# Patient Record
Sex: Female | Born: 1985 | Race: White | Hispanic: No | Marital: Married | Smoking: Never smoker
Health system: Southern US, Community
[De-identification: ages and names within clinical notes are randomized; demographics above are authoritative.]

## PROBLEM LIST (undated history)

## (undated) DIAGNOSIS — F419 Anxiety disorder, unspecified: Secondary | ICD-10-CM

## (undated) DIAGNOSIS — D509 Iron deficiency anemia, unspecified: Secondary | ICD-10-CM

## (undated) DIAGNOSIS — E538 Deficiency of other specified B group vitamins: Secondary | ICD-10-CM

## (undated) DIAGNOSIS — E871 Hypo-osmolality and hyponatremia: Secondary | ICD-10-CM

## (undated) DIAGNOSIS — N80129 Deep endometriosis of ovary, unspecified ovary: Secondary | ICD-10-CM

## (undated) DIAGNOSIS — E559 Vitamin D deficiency, unspecified: Secondary | ICD-10-CM

## (undated) DIAGNOSIS — K219 Gastro-esophageal reflux disease without esophagitis: Secondary | ICD-10-CM

## (undated) DIAGNOSIS — I10 Essential (primary) hypertension: Secondary | ICD-10-CM

## (undated) DIAGNOSIS — F32A Depression, unspecified: Secondary | ICD-10-CM

## (undated) DIAGNOSIS — R112 Nausea with vomiting, unspecified: Secondary | ICD-10-CM

## (undated) DIAGNOSIS — Z9889 Other specified postprocedural states: Secondary | ICD-10-CM

## (undated) HISTORY — PX: WISDOM TOOTH EXTRACTION: SHX21

---
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
## 2004-03-02 ENCOUNTER — Other Ambulatory Visit: Admission: RE | Admit: 2004-03-02 | Discharge: 2004-03-02 | Payer: Self-pay | Admitting: Obstetrics and Gynecology

## 2008-04-25 ENCOUNTER — Ambulatory Visit: Payer: Self-pay | Admitting: Vascular Surgery

## 2008-04-25 ENCOUNTER — Encounter (INDEPENDENT_AMBULATORY_CARE_PROVIDER_SITE_OTHER): Payer: Self-pay | Admitting: Obstetrics and Gynecology

## 2008-04-25 ENCOUNTER — Ambulatory Visit (HOSPITAL_COMMUNITY): Admission: RE | Admit: 2008-04-25 | Discharge: 2008-04-25 | Payer: Self-pay | Admitting: Obstetrics and Gynecology

## 2009-02-28 ENCOUNTER — Ambulatory Visit: Payer: Self-pay | Admitting: Family Medicine

## 2009-02-28 DIAGNOSIS — R030 Elevated blood-pressure reading, without diagnosis of hypertension: Secondary | ICD-10-CM

## 2009-02-28 LAB — CONVERTED CEMR LAB
ALT: 19 units/L (ref 0–35)
AST: 20 units/L (ref 0–37)
Basophils Relative: 0.1 % (ref 0.0–3.0)
Bilirubin, Direct: 0.1 mg/dL (ref 0.0–0.3)
Chloride: 108 meq/L (ref 96–112)
Eosinophils Relative: 0.9 % (ref 0.0–5.0)
GFR calc non Af Amer: 109.26 mL/min (ref >60)
HCT: 39.7 % (ref 36.0–46.0)
Lymphs Abs: 2.2 10*3/uL (ref 0.7–4.0)
MCV: 82.2 fL (ref 78.0–100.0)
Monocytes Absolute: 0.5 10*3/uL (ref 0.1–1.0)
Monocytes Relative: 5.7 % (ref 3.0–12.0)
Neutrophils Relative %: 69.1 % (ref 43.0–77.0)
Potassium: 3.9 meq/L (ref 3.5–5.1)
RBC: 4.83 M/uL (ref 3.87–5.11)
Total Bilirubin: 1 mg/dL (ref 0.3–1.2)
Total Protein: 7.1 g/dL (ref 6.0–8.3)
WBC: 8.9 10*3/uL (ref 4.5–10.5)

## 2009-03-03 ENCOUNTER — Ambulatory Visit: Payer: Self-pay | Admitting: Family Medicine

## 2009-04-06 ENCOUNTER — Ambulatory Visit: Payer: Self-pay | Admitting: Family Medicine

## 2009-04-06 DIAGNOSIS — R609 Edema, unspecified: Secondary | ICD-10-CM

## 2010-01-23 ENCOUNTER — Ambulatory Visit: Payer: Self-pay | Admitting: Obstetrics and Gynecology

## 2010-01-23 ENCOUNTER — Inpatient Hospital Stay (HOSPITAL_COMMUNITY): Admission: AD | Admit: 2010-01-23 | Discharge: 2010-01-23 | Payer: Self-pay | Admitting: Obstetrics & Gynecology

## 2010-08-03 ENCOUNTER — Inpatient Hospital Stay (HOSPITAL_COMMUNITY): Admission: AD | Admit: 2010-08-03 | Discharge: 2010-08-05 | Payer: Self-pay | Admitting: Obstetrics and Gynecology

## 2010-12-04 LAB — CBC
HCT: 33.6 % — ABNORMAL LOW (ref 36.0–46.0)
MCH: 28.2 pg (ref 26.0–34.0)
MCHC: 33.9 g/dL (ref 30.0–36.0)
MCHC: 34.1 g/dL (ref 30.0–36.0)
MCV: 83.1 fL (ref 78.0–100.0)
MCV: 84.3 fL (ref 78.0–100.0)
Platelets: 207 10*3/uL (ref 150–400)
Platelets: 243 10*3/uL (ref 150–400)
RBC: 3.56 MIL/uL — ABNORMAL LOW (ref 3.87–5.11)
RDW: 13.8 % (ref 11.5–15.5)
RDW: 13.9 % (ref 11.5–15.5)
WBC: 12.3 10*3/uL — ABNORMAL HIGH (ref 4.0–10.5)

## 2010-12-11 LAB — BASIC METABOLIC PANEL
Chloride: 102 mEq/L (ref 96–112)
Creatinine, Ser: 0.6 mg/dL (ref 0.4–1.2)
GFR calc Af Amer: >60 mL/min (ref >60)
GFR calc non Af Amer: >60 mL/min (ref >60)

## 2010-12-11 LAB — URINE MICROSCOPIC-ADD ON

## 2010-12-11 LAB — URINALYSIS, ROUTINE W REFLEX MICROSCOPIC
Bilirubin Urine: NEGATIVE
Ketones, ur: 15 mg/dL — AB
Nitrite: NEGATIVE
Protein, ur: NEGATIVE mg/dL
Urobilinogen, UA: 1 mg/dL (ref 0.0–1.0)

## 2010-12-11 LAB — CBC
MCV: 85.1 fL (ref 78.0–100.0)
Platelets: 326 10*3/uL (ref 150–400)
RBC: 4.35 MIL/uL (ref 3.87–5.11)
WBC: 8.9 10*3/uL (ref 4.0–10.5)

## 2011-11-26 ENCOUNTER — Encounter: Payer: Self-pay | Admitting: Family Medicine

## 2011-11-27 ENCOUNTER — Ambulatory Visit (INDEPENDENT_AMBULATORY_CARE_PROVIDER_SITE_OTHER): Payer: BC Managed Care – PPO | Admitting: Family Medicine

## 2011-11-27 ENCOUNTER — Encounter: Payer: Self-pay | Admitting: Family Medicine

## 2011-11-27 DIAGNOSIS — H8110 Benign paroxysmal vertigo, unspecified ear: Secondary | ICD-10-CM

## 2011-11-27 NOTE — Progress Notes (Signed)
  Subjective:    Patient ID: Melanie Carey, female    DOB: Jun 24, 1986, 26 y.o.   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 No spinning. No hearing loss otherwise well. These episodes last for about 30 minutes and occur 2-3 times a day. Review of systems negative   Review of Systems General and ENT and neurologic review of systems otherwise negative    Objective:   Physical Exam  Well-developed well-nourished female in no acute distress neurologic exam within normal limits cardiac exam normal cerebellar testing normal      Assessment & Plan:  Benign vertigo reassured return when necessary

## 2012-08-06 ENCOUNTER — Other Ambulatory Visit: Payer: Self-pay | Admitting: Cardiology

## 2012-08-06 ENCOUNTER — Encounter: Payer: Self-pay | Admitting: Family Medicine

## 2012-08-06 ENCOUNTER — Ambulatory Visit (INDEPENDENT_AMBULATORY_CARE_PROVIDER_SITE_OTHER): Payer: BC Managed Care – PPO | Admitting: Family Medicine

## 2012-08-06 ENCOUNTER — Other Ambulatory Visit: Payer: Self-pay | Admitting: Family Medicine

## 2012-08-06 ENCOUNTER — Encounter (INDEPENDENT_AMBULATORY_CARE_PROVIDER_SITE_OTHER): Payer: BC Managed Care – PPO

## 2012-08-06 VITALS — BP 140/94 | Temp 98.6°F | Wt 140.0 lb

## 2012-08-06 DIAGNOSIS — M79606 Pain in leg, unspecified: Secondary | ICD-10-CM

## 2012-08-06 DIAGNOSIS — M79609 Pain in unspecified limb: Secondary | ICD-10-CM

## 2012-08-06 DIAGNOSIS — M79669 Pain in unspecified lower leg: Secondary | ICD-10-CM

## 2012-08-06 DIAGNOSIS — R609 Edema, unspecified: Secondary | ICD-10-CM

## 2012-08-06 DIAGNOSIS — R600 Localized edema: Secondary | ICD-10-CM

## 2012-08-06 DIAGNOSIS — M7989 Other specified soft tissue disorders: Secondary | ICD-10-CM

## 2012-08-06 NOTE — Progress Notes (Signed)
  Subjective:    Patient ID: Melanie Carey, female    DOB: May 08, 1986, 26 y.o.   MRN: [id number]  HPI Melanie Carey is a 26 year old married female nonsmoker ,,,,,,,,,,, occupation Airline pilot,,,,,,,,,, who comes in today for evaluation of swelling of her right ankle and calf  She states that 3 years ago she had some swelling of her left lower extremity. We saw her did an evaluation which was negative. She subsequently went to an orthopedist had an MRI which was normal. That left leg continues to swell.  On Tuesday around noontime she noticed a gradual onset of swelling and pain in her right ankle. No history of trauma. She is on BCPs LMP 2 weeks ago she has a 27-year-old daughter.  No history of chest pain shortness of breath etc. etc.    Review of Systems    general and cardiopulmonary of systems otherwise negative Objective:   Physical Exam  Well-developed well-nourished female no acute distress examination of the right leg shows some tenderness to movement diffuse 1+ to trace edema tender cord left medial calf      Assessment & Plan:  Pain and swelling right lower extremity rule out DVT Doppler ASAP today

## 2012-08-06 NOTE — Patient Instructions (Addendum)
We will set up a venous Doppler ASAP  Take one aspirin tablet 3 times daily  Elevate that right leg in the evening when necessary  I will call you when I get the report on your Doppler  Also because of a slight elevation of her blood pressure asked her to do a BP check daily at home and call me with the data in 3 weeks

## 2012-08-14 ENCOUNTER — Other Ambulatory Visit: Payer: Self-pay | Admitting: Family

## 2012-08-14 ENCOUNTER — Ambulatory Visit (INDEPENDENT_AMBULATORY_CARE_PROVIDER_SITE_OTHER): Payer: BC Managed Care – PPO | Admitting: Family

## 2012-08-14 ENCOUNTER — Telehealth: Payer: Self-pay | Admitting: Family Medicine

## 2012-08-14 ENCOUNTER — Telehealth: Payer: Self-pay

## 2012-08-14 ENCOUNTER — Encounter: Payer: Self-pay | Admitting: Family

## 2012-08-14 VITALS — BP 154/102 | HR 90 | Temp 98.8°F | Wt 140.0 lb

## 2012-08-14 DIAGNOSIS — I10 Essential (primary) hypertension: Secondary | ICD-10-CM

## 2012-08-14 DIAGNOSIS — R609 Edema, unspecified: Secondary | ICD-10-CM

## 2012-08-14 LAB — BASIC METABOLIC PANEL
CO2: 29 mEq/L (ref 19–32)
Chloride: 100 mEq/L (ref 96–112)
Glucose, Bld: 84 mg/dL (ref 70–99)
Potassium: 4.2 mEq/L (ref 3.5–5.1)
Sodium: 136 mEq/L (ref 135–145)

## 2012-08-14 MED ORDER — METOPROLOL SUCCINATE ER 25 MG PO TB24: 25.0000 mg | ORAL_TABLET | Freq: Every day | ORAL | Status: DC

## 2012-08-14 MED ORDER — HYDROCHLOROTHIAZIDE 25 MG PO TABS: 12.5000 mg | ORAL_TABLET | Freq: Every day | ORAL | Status: DC

## 2012-08-14 NOTE — Telephone Encounter (Signed)
Left message to notify pt of NP note and to advise rx sent to pharmacy

## 2012-08-14 NOTE — Progress Notes (Signed)
  Subjective:    Patient ID: Melanie Carey, female    DOB: 22-May-1986, 26 y.o.   MRN: [id number]  HPI 26 year old white female, nonsmoker, patient of Dr. Tawanna Cooler, is in today with elevated blood pressure x several years but worsening. She reports taking her blood pressure at home and has recently been unable to get her systolic below 100. She has periodically had headaches. Also c/o edema in her ankles that has recently worsened. She has not been exercising.     Review of Systems  Constitutional: Negative.   Respiratory: Negative.   Cardiovascular: Positive for leg swelling. Negative for chest pain and palpitations.  Gastrointestinal: Negative.   Genitourinary: Negative.   Musculoskeletal: Negative.   Skin: Negative.   Neurological: Negative.   Psychiatric/Behavioral: Negative.    No past medical history on file.  History   Social History  . Marital Status: Married    Spouse Name: N/A    Number of Children: N/A  . Years of Education: N/A   Occupational History  . Not on file.   Social History Main Topics  . Smoking status: Never Smoker   . Smokeless tobacco: Not on file  . Alcohol Use: No  . Drug Use: No  . Sexually Active:    Other Topics Concern  . Not on file   Social History Narrative  . No narrative on file    No past surgical history on file.  Family History  Problem Relation Age of Onset  . Cancer Mother     breast  . Hypertension Father   . Hyperlipidemia Father     No Known Allergies  Current Outpatient Prescriptions on File Prior to Visit  Medication Sig Dispense Refill  . norgestrel-ethinyl estradiol (LO/OVRAL,CRYSELLE) 0.3-30 MG-MCG tablet Take 1 tablet by mouth daily.      . hydrochlorothiazide (HYDRODIURIL) 25 MG tablet Take 0.5 tablets (12.5 mg total) by mouth daily.  15 tablet  3    BP 154/102  Pulse 90  Temp 98.8 F (37.1 C) (Oral)  Wt 140 lb (63.504 kg)  SpO2 99%chart    Objective:   Physical Exam  Constitutional: She is oriented  to person, place, and time. She appears well-nourished.  Neck: Normal range of motion. Neck supple.  Cardiovascular: Normal rate, regular rhythm and normal heart sounds.   Pulmonary/Chest: Effort normal and breath sounds normal.  Abdominal: Soft. Bowel sounds are normal.  Musculoskeletal: Normal range of motion.  Neurological: She is alert and oriented to person, place, and time.  Skin: Skin is warm and dry.  Psychiatric: She has a normal mood and affect.          Assessment & Plan:  Assessment: Hypertension, Peripheral Edema  Plan: Start HCTZ 12.5 mg once daily. Encouraged a low sodium diet. Exercise 30 minutes most days of the week. Patient to recheck with Dr. Tawanna Cooler or myself in 2 weeks and sooner when necessary.

## 2012-08-14 NOTE — Telephone Encounter (Signed)
Per Padonda, hctz 12.5mg  qd is safe for pregnancy if pt were to become pregnant while taking it. Pt aware and verbalized understanding

## 2012-08-14 NOTE — Telephone Encounter (Signed)
Message copied by Beverely Low on Fri Aug 14, 2012  3:11 PM ------      Message from: Brooks, [place] B      Created: Fri Aug 14, 2012  1:51 PM       Both category B medications. But if she wants to switch, start Metoprolol 50mg  XL once daily.

## 2012-08-14 NOTE — Patient Instructions (Addendum)
1.5 Gram Low Sodium Diet A 1.5 gram sodium diet restricts the amount of sodium in the diet to no more than 1.5 g or 1500 mg daily. The American Heart Association recommends Americans over the age of 20 to consume no more than 1500 mg of sodium each day to reduce the risk of developing high blood pressure. Research also shows that limiting sodium may reduce heart attack and stroke risk. Many foods contain sodium for flavor and sometimes as a preservative. When the amount of sodium in a diet needs to be low, it is important to know what to look for when choosing foods and drinks. The following includes some information and guidelines to help make it easier for you to adapt to a low sodium diet. QUICK TIPS  Do not add salt to food.  Avoid convenience items and fast food.  Choose unsalted snack foods.  Buy lower sodium products, often labeled as "lower sodium" or "no salt added."  Check food labels to learn how much sodium is in 1 serving.  When eating at a restaurant, ask that your food be prepared with less salt or none, if possible. READING FOOD LABELS FOR SODIUM INFORMATION The nutrition facts label is a good place to find how much sodium is in foods. Look for products with no more than 400 mg of sodium per serving. Remember that 1.5 g = 1500 mg. The food label may also list foods as:  Sodium-free: Less than 5 mg in a serving.  Very low sodium: 35 mg or less in a serving.  Low-sodium: 140 mg or less in a serving.  Light in sodium: 50% less sodium in a serving. For example, if a food that usually has 300 mg of sodium is changed to become light in sodium, it will have 150 mg of sodium.  Reduced sodium: 25% less sodium in a serving. For example, if a food that usually has 400 mg of sodium is changed to reduced sodium, it will have 300 mg of sodium. CHOOSING FOODS Grains  Avoid: Salted crackers and snack items. Some cereals, including instant hot cereals. Bread stuffing and biscuit mixes.  Seasoned rice or pasta mixes.  Choose: Unsalted snack items. Low-sodium cereals, oats, puffed wheat and rice, shredded wheat. English muffins and bread. Pasta. Meats  Avoid: Salted, canned, smoked, spiced, pickled meats, including fish and poultry. Bacon, ham, sausage, cold cuts, hot dogs, anchovies.  Choose: Low-sodium canned tuna and salmon. Fresh or frozen meat, poultry, and fish. Dairy  Avoid: Processed cheese and spreads. Cottage cheese. Buttermilk and condensed milk. Regular cheese.  Choose: Milk. Low-sodium cottage cheese. Yogurt. Sour cream. Low-sodium cheese. Fruits and Vegetables  Avoid: Regular canned vegetables. Regular canned tomato sauce and paste. Frozen vegetables in sauces. Olives. Pickles. Relishes. Sauerkraut.  Choose: Low-sodium canned vegetables. Low-sodium tomato sauce and paste. Frozen or fresh vegetables. Fresh and frozen fruit. Condiments  Avoid: Canned and packaged gravies. Worcestershire sauce. Tartar sauce. Barbecue sauce. Soy sauce. Steak sauce. Ketchup. Onion, garlic, and table salt. Meat flavorings and tenderizers.  Choose: Fresh and dried herbs and spices. Low-sodium varieties of mustard and ketchup. Lemon juice. Tabasco sauce. Horseradish. SAMPLE 1.5 GRAM SODIUM MEAL PLAN Breakfast / Sodium (mg)  1 cup low-fat milk / 143 mg  1 whole-wheat English muffin / 240 mg  1 tbs heart-healthy margarine / 153 mg  1 hard-boiled egg / 139 mg  1 small orange / 0 mg Lunch / Sodium (mg)  1 cup raw carrots / 76 mg  2   tbs no salt added peanut butter / 5 mg  2 slices whole-wheat bread / 270 mg  1 tbs jelly / 6 mg   cup red grapes / 2 mg Dinner / Sodium (mg)  1 cup whole-wheat pasta / 2 mg  1 cup low-sodium tomato sauce / 73 mg  3 oz lean ground beef / 57 mg  1 small side salad (1 cup raw spinach leaves,  cup cucumber,  cup yellow bell pepper) with 1 tsp olive oil and 1 tsp red wine vinegar / 25 mg Snack / Sodium (mg)  1 container low-fat  vanilla yogurt / 107 mg  3 graham cracker squares / 127 mg Nutrient Analysis  Calories: 1745  Protein: 75 g  Carbohydrate: 237 g  Fat: 57 g  Sodium: 1425 mg Document Released: 09/09/2005 Document Revised: 12/02/2011 Document Reviewed: 12/11/2009 [facility] Patient Information 2013 [place], [place].  Hypertension As your heart beats, it forces blood through your arteries. This force is your blood pressure. If the pressure is too high, it is called hypertension (HTN) or high blood pressure. HTN is dangerous because you may have it and not know it. High blood pressure may mean that your heart has to work harder to pump blood. Your arteries may be narrow or stiff. The extra work puts you at risk for heart disease, stroke, and other problems.  Blood pressure consists of two numbers, a higher number over a lower, 110/72, for example. It is stated as "110 over 72." The ideal is below 120 for the top number (systolic) and under 80 for the bottom (diastolic). Write down your blood pressure today. You should pay close attention to your blood pressure if you have certain conditions such as:  Heart failure.  Prior heart attack.  Diabetes  Chronic kidney disease.  Prior stroke.  Multiple risk factors for heart disease. To see if you have HTN, your blood pressure should be measured while you are seated with your arm held at the level of the heart. It should be measured at least twice. A one-time elevated blood pressure reading (especially in the Emergency Department) does not mean that you need treatment. There may be conditions in which the blood pressure is different between your right and left arms. It is important to see your caregiver soon for a recheck. Most people have essential hypertension which means that there is not a specific cause. This type of high blood pressure may be lowered by changing lifestyle factors such as:  Stress.  Smoking.  Lack of exercise.  Excessive  weight.  Drug/tobacco/alcohol use.  Eating less salt. Most people do not have symptoms from high blood pressure until it has caused damage to the body. Effective treatment can often prevent, delay or reduce that damage. TREATMENT  When a cause has been identified, treatment for high blood pressure is directed at the cause. There are a large number of medications to treat HTN. These fall into several categories, and your caregiver will help you select the medicines that are best for you. Medications may have side effects. You should review side effects with your caregiver. If your blood pressure stays high after you have made lifestyle changes or started on medicines,   Your medication(s) may need to be changed.  Other problems may need to be addressed.  Be certain you understand your prescriptions, and know how and when to take your medicine.  Be sure to follow up with your caregiver within the time frame advised (usually within two weeks)  to have your blood pressure rechecked and to review your medications.  If you are taking more than one medicine to lower your blood pressure, make sure you know how and at what times they should be taken. Taking two medicines at the same time can result in blood pressure that is too low. SEEK IMMEDIATE MEDICAL CARE IF:  You develop a severe headache, blurred or changing vision, or confusion.  You have unusual weakness or numbness, or a faint feeling.  You have severe chest or abdominal pain, vomiting, or breathing problems. MAKE SURE YOU:   Understand these instructions.  Will watch your condition.  Will get help right away if you are not doing well or get worse. Document Released: 09/09/2005 Document Revised: 12/02/2011 Document Reviewed: 04/29/2008 [facility] Patient Information 2013 [place], [place].

## 2012-08-14 NOTE — Telephone Encounter (Signed)
Patient Information:  Caller Name: Joene  Phone: [phone]  Patient: Melanie Carey, Melanie Carey  Gender: Female  DOB: 1986-09-20  Age: 26 Years  PCP: Kelle Darting [facility])  Pregnant: No   Symptoms  Reason For Call & Symptoms: swelling right lower leg and foot.  BP 140/100 all week; also has headaches intermittently x 1 month  Reviewed Health History In EMR: Yes  Reviewed Medications In EMR: Yes  Reviewed Allergies In EMR: Yes  Date of Onset of Symptoms: 08/03/2012 OB:  LMP: 07/30/2012  Guideline(s) Used:  Leg Swelling and Edema  Disposition Per Guideline:   See Today in Office  Reason For Disposition Reached:   Moderate swelling of both ankles (e.g., swelling extends up to the knees) AND new onset or worsening  Advice Given:  N/A  Office Follow Up:  Does the office need to follow up with this patient?: No  Instructions For The Office: N/A  Appointment Scheduled:  08/14/2012 10:30:00  RN Note:  Seen in office 08/06/12 and told to monitor BP; BP has been 140's/100 all week.  Appt scheduled with Ms. Orvan Falconer 08/14/12 1030, as Dr. Tawanna Cooler not in office krs/can

## 2012-08-14 NOTE — Telephone Encounter (Signed)
Pt called and said that she has questions re: hydrochlorothiazide (HYDRODIURIL) 25 MG tablet

## 2012-10-02 ENCOUNTER — Ambulatory Visit (INDEPENDENT_AMBULATORY_CARE_PROVIDER_SITE_OTHER): Payer: BC Managed Care – PPO | Admitting: Family Medicine

## 2012-10-02 ENCOUNTER — Encounter: Payer: Self-pay | Admitting: Family Medicine

## 2012-10-02 VITALS — BP 130/80 | HR 84 | Temp 98.7°F | Wt 144.0 lb

## 2012-10-02 DIAGNOSIS — J069 Acute upper respiratory infection, unspecified: Secondary | ICD-10-CM

## 2012-10-02 MED ORDER — GUAIFENESIN-CODEINE 100-10 MG/5ML PO SYRP: 5.0000 mL | ORAL_SOLUTION | Freq: Three times a day (TID) | ORAL | Status: DC | PRN

## 2012-10-02 NOTE — Progress Notes (Signed)
Chief Complaint  Patient presents with  . Cough    congestion, runny nose, ears stopped up, sore throat, pressure in head 1 week     HPI: ? URI: -started: 1 week -symptoms:nasal congestion, sore throat, cough, having some sinus pressure, ears are full, hoarseness -denies:fever, SOB, NVD, tooth pain -has tried: dayquil -sick contacts: no flu exposure, friend's daughter sick -Hx of: sinusitis  ROS: See pertinent positives and negatives per HPI.  No past medical history on file.  Family History  Problem Relation Age of Onset  . Cancer Mother     breast  . Hypertension Father   . Hyperlipidemia Father     History   Social History  . Marital Status: Married    Spouse Name: N/A    Number of Children: N/A  . Years of Education: N/A   Social History Main Topics  . Smoking status: Never Smoker   . Smokeless tobacco: None  . Alcohol Use: No  . Drug Use: No  . Sexually Active:    Other Topics Concern  . None   Social History Narrative  . None    Current outpatient prescriptions:hydrochlorothiazide (HYDRODIURIL) 25 MG tablet, Take 0.5 tablets (12.5 mg total) by mouth daily., Disp: 15 tablet, Rfl: 3;  metoprolol succinate (TOPROL-XL) 25 MG 24 hr tablet, Take 1 tablet (25 mg total) by mouth daily., Disp: 90 tablet, Rfl: 3;  norgestrel-ethinyl estradiol (LO/OVRAL,CRYSELLE) 0.3-30 MG-MCG tablet, Take 1 tablet by mouth daily., Disp: , Rfl:  guaiFENesin-codeine (ROBITUSSIN AC) 100-10 MG/5ML syrup, Take 5 mLs by mouth 3 (three) times daily as needed for cough., Disp: 120 mL, Rfl: 0  EXAM:  Filed Vitals:   10/02/12 0909  BP: 130/80  Pulse: 84  Temp: 98.7 F (37.1 C)    There is no height on file to calculate BMI.  GENERAL: vitals reviewed and listed above, alert, oriented, appears well hydrated and in no acute distress  HEENT: atraumatic, conjunttiva clear, no obvious abnormalities on inspection of external nose and ears, normal appearance of ear canals and TMs, clear  nasal congestion, mild post oropharyngeal erythema with PND, no tonsillar edema or exudate, no sinus TTP  NECK: no obvious masses on inspection  LUNGS: clear to auscultation bilaterally, no wheezes, rales or rhonchi, good air movement  CV: HRRR, no peripheral edema  MS: moves all extremities without noticeable abnormality  PSYCH: pleasant and cooperative, no obvious depression or anxiety  ASSESSMENT AND PLAN:  Discussed the following assessment and plan:  1. Upper respiratory infection  guaiFENesin-codeine (ROBITUSSIN AC) 100-10 MG/5ML syrup   -supportive care, return precautions, cough med (risks discussed) -Patient advised to return or notify a doctor immediately if symptoms worsen or persist or new concerns arise.  Patient Instructions  INSTRUCTIONS FOR UPPER RESPIRATORY INFECTION:  -plenty of rest and fluids  -nasal saline wash 2-3 times daily (use prepackaged nasal saline or bottled/distilled water if making your own)   -can use sinex nasal spray for drainage and nasal congestion - but do NOT use longer then 3-4 days  -can use tylenol or ibuprofen as directed for aches and sorethroat  -in the winter time, using a humidifier at night is helpful (please follow cleaning instructions)  -if you are taking a cough medication - use only as directed, may also try a teaspoon of honey to coat the throat and throat lozenges  -for sore throat, salt water gargles can help  -follow up if you have fevers, facial pain, tooth pain, difficulty breathing or are worsening  or not getting better in 5-7 days      KIM, [facility] R.

## 2012-10-02 NOTE — Patient Instructions (Addendum)

## 2012-10-08 ENCOUNTER — Telehealth: Payer: Self-pay | Admitting: Family Medicine

## 2012-10-08 MED ORDER — AMOXICILLIN-POT CLAVULANATE 875-125 MG PO TABS: 1.0000 | ORAL_TABLET | Freq: Two times a day (BID) | ORAL | Status: DC

## 2012-10-08 NOTE — Telephone Encounter (Signed)
Melanie Carey,  Please call and see how she is doing. If much worse or fevers - needs to be seen. Otherwise can do amoxicillin 875 bid for 10 days for possible sinusits given symptoms > 10 days. Take complete course as instructed. If any diarrhea or reaction to this medication should stop and call us. Can cause diarrhea, allergic reaction and many other side effects - she can discuss with the pharmacist. Just finishing period per report so pregnancy unlikely.

## 2012-10-08 NOTE — Telephone Encounter (Signed)
Left a message for pt to return call 

## 2012-10-08 NOTE — Telephone Encounter (Signed)
Patient Information:  Caller Name: Teisha  Phone: [phone]  Patient: Melanie Carey, Melanie Carey  Gender: Female  DOB: [birth year]  Age: 27 Years  PCP: Kelle Darting [facility])  Pregnant: No  Office Follow Up:  Does the office need to follow up with this patient?: Yes  Instructions For The Office: request RX  RN Note:  Patient is calling for medication. Antibiotic.  Does not feel well. Symptoms have moved into face/ear.  She is at work and does not want to miss work if she can help it.  See in the office by Kriste Basque DO on 10/02/12.  Please contact.  Aware of office policy regarding antibiotics  Symptoms  Reason For Call & Symptoms: Patient states she has been sick since Monday 09/29/11 with URI.  She was seen by Herold Harms and given Robitussin AC.  She states the cough is better but has moved back into her forehead pressure, ears are ringing and Headache, +runny nose  +Clear  Reviewed Health History In EMR: Yes  Reviewed Medications In EMR: Yes  Reviewed Allergies In EMR: Yes  Reviewed Surgeries / Procedures: No  Date of Onset of Symptoms: 09/28/2012  Treatments Tried: Robitussin AC-, saliene spray, Excedrin Migraine.  Treatments Tried Worked: No OB / GYN:  LMP: 09/26/2012  Guideline(s) Used:  Sinus Pain and Congestion  Disposition Per Guideline:   See Today or Tomorrow in Office  Reason For Disposition Reached:   Sinus congestion (pressure, fullness) present > 10 days  Advice Given:  For a Runny Nose With Profuse Discharge:  Nasal mucus and discharge helps to wash viruses and bacteria out of the nose and sinuses.  Blowing the nose is all that is needed.  If the skin around your nostrils gets irritated, apply a tiny amount of petroleum ointment to the nasal openings once or twice a day.  For a Stuffy Nose - Use Nasal Washes:  Introduction: Saline (salt water) nasal irrigation (nasal wash) is an effective and simple home remedy for treating stuffy nose and sinus congestion. The  nose can be irrigated by pouring, spraying, or squirting salt water into the nose and then letting it run back out.  How it Helps: The salt water rinses out excess mucus, washes out any irritants (dust, allergens) that might be present, and moistens the nasal cavity.  Methods: There are several ways to perform nasal irrigation. You can use a saline nasal spray bottle (available over-the-counter), a rubber ear syringe, a medical syringe without the needle, or a Neti Pot.  Pain and Fever Medicines:  Ibuprofen (e.g., Motrin, Advil):  Take 400 mg (two 200 mg pills) by mouth every 6 hours.  Another choice is to take 600 mg (three 200 mg pills) by mouth every 8 hours.  Call Back If:   Severe pain lasts longer than 2 hours after pain medicine  Sinus pain lasts longer than 1 day after starting treatment using nasal washes  Sinus congestion (fullness) lasts longer than 10 days  Fever lasts longer than 3 days  You become worse.  Hydration:  Drink plenty of liquids (6-8 glasses of water daily). If the air in your home is dry, use a cool mist humidifier  Expected Course:  Sinus congestion from viral upper respiratory infections (colds) usually lasts 5-10 days.  Occasionally a cold can worsen and turn into bacterial sinusitis. Clues to this are sinus symptoms lasting longer than 10 days, fever lasting longer than 3 days, and worsening pain. Bacterial sinusitis may need antibiotic  treatment.  Patient Refused Recommendation:  Patient Requests Prescription  Request RX

## 2012-10-08 NOTE — Telephone Encounter (Signed)
pls advise

## 2012-10-08 NOTE — Telephone Encounter (Signed)
Rx sent to pharmacy.  Called and spoke with pt and pt denies fever.  Pt states she still has sinus headache and ringing in her ears.  Pt states it has been 12 days and she just wants some relief.  Pt aware rx sent to pharmacy.  Discussed poss side effects.

## 2012-10-08 NOTE — Addendum Note (Signed)
Addended by: Azucena Freed on: 10/08/2012 04:30 PM   Modules accepted: Orders

## 2012-10-30 ENCOUNTER — Telehealth: Payer: Self-pay | Admitting: Family Medicine

## 2012-10-30 NOTE — Telephone Encounter (Signed)
Patient Information:  Caller Name: Mellody  Phone: [phone]  Patient: Melanie Carey, Melanie Carey  Gender: Female  DOB: [birth year]  Age: 27 Years  PCP: Kelle Darting [facility])  Pregnant: No  Office Follow Up:  Does the office need to follow up with this patient?: No  Instructions For The Office: N/A  RN Note:  Onset of ear ringing was with cold that was treated with antibiotics.  However, the ringing or background sound has not improved and feels like it has gotten to be higher in pitch.  Patient feels like she can hear it just is always there in the background.  It is worse at night when things are quiet, she is more aware of it. Is having slight dizziness.  Appointment schedule with Dr. Tawanna Cooler for 11/02/12 at 11:00.  Symptoms  Reason For Call & Symptoms: Ringing in ears  Reviewed Health History In EMR: Yes  Reviewed Medications In EMR: Yes  Reviewed Allergies In EMR: Yes  Reviewed Surgeries / Procedures: Yes  Date of Onset of Symptoms: 09/30/2012  Treatments Tried: antibiotic  Treatments Tried Worked: No OB / GYN:  LMP: 10/17/2012  Guideline(s) Used:  Hearing Loss  Ear - Congestion  Disposition Per Guideline:   See Within 3 Days in Office  Reason For Disposition Reached:   Ear congestion present > 48 hours  Advice Given:  Treatment - Chewing and Swallowing:   Try chewing gum.  You can also try swallowing water while pinching your nostrils closed. The reason this works is that it creates a small vacuum in the nose. This helps the eustachian tube to open up.  Treatment - Decongestant Nasal Spray:  If chewing or swallowing doesn't help after 1 or 2 hours, you can try using an over-the-counter (OTC) nasal decongestant drops. You can use the nose drops twice a day.  Call Back If:   Ear congestion lasts over 48 hours  Ear pain or fever occurs  You become worse.  Appointment Scheduled:  11/02/2012 11:00:00 Appointment Scheduled Provider:  Kelle Darting [facility])

## 2012-10-30 NOTE — Telephone Encounter (Signed)
noted 

## 2012-11-02 ENCOUNTER — Ambulatory Visit: Payer: Self-pay | Admitting: Family Medicine

## 2012-11-02 DIAGNOSIS — Z0289 Encounter for other administrative examinations: Secondary | ICD-10-CM

## 2012-12-01 ENCOUNTER — Ambulatory Visit (INDEPENDENT_AMBULATORY_CARE_PROVIDER_SITE_OTHER): Payer: BC Managed Care – PPO | Admitting: Family Medicine

## 2012-12-01 ENCOUNTER — Encounter: Payer: Self-pay | Admitting: Family Medicine

## 2012-12-01 VITALS — BP 138/80 | HR 67 | Temp 98.6°F | Wt 142.0 lb

## 2012-12-01 DIAGNOSIS — H6990 Unspecified Eustachian tube disorder, unspecified ear: Secondary | ICD-10-CM

## 2012-12-01 DIAGNOSIS — H6991 Unspecified Eustachian tube disorder, right ear: Secondary | ICD-10-CM

## 2012-12-01 DIAGNOSIS — I10 Essential (primary) hypertension: Secondary | ICD-10-CM

## 2012-12-01 MED ORDER — FLUTICASONE PROPIONATE 50 MCG/ACT NA SUSP: 2.0000 | Freq: Every day | NASAL | Status: DC

## 2012-12-01 MED ORDER — HYDROCHLOROTHIAZIDE 25 MG PO TABS: 25.0000 mg | ORAL_TABLET | Freq: Every day | ORAL | Status: DC

## 2012-12-01 NOTE — Progress Notes (Signed)
Chief Complaint  Patient presents with  . 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  No past medical history on file.  Family History  Problem Relation Age of Onset  . Cancer Mother     breast  . Hypertension Father   . Hyperlipidemia Father     History   Social History  . Marital Status: Married    Spouse Name: N/A    Number of Children: N/A  . Years of Education: N/A   Social History Main Topics  . Smoking status: Never Smoker   . Smokeless tobacco: None  . Alcohol Use: No  . Drug Use: No  . Sexually Active:    Other Topics Concern  . None   Social History Narrative  . None    Current outpatient prescriptions:norgestrel-ethinyl estradiol (LO/OVRAL,CRYSELLE) 0.3-30 MG-MCG tablet, Take 1 tablet by mouth daily., Disp: , Rfl: ;  fluticasone (FLONASE) 50 MCG/ACT nasal spray, Place 2 sprays into the nose daily., Disp: 16 g, Rfl: 0;  hydrochlorothiazide (HYDRODIURIL) 25 MG tablet, Take 1 tablet (25 mg total) by mouth daily., Disp: 30 tablet, Rfl: 3  EXAM:  Filed Vitals:   12/01/12 1408  BP: 138/80  Pulse: 67  Temp: 98.6 F (37 C)     Body mass index is 20.96 kg/(m^2).  GENERAL: vitals reviewed and listed above, alert, oriented, appears well hydrated and in no acute distress  HEENT: atraumatic, conjunttiva clear, no obvious abnormalities on inspection of external nose and ears, normal appearance of ear canals and TMs except for clear effusion R, clear nasal congestion with pale boggy turbinates, mild post oropharyngeal erythema with PND, no tonsillar edema or exudate, no sinus TTP  NECK: no obvious masses on inspection  LUNGS: clear to auscultation bilaterally, no wheezes, rales or rhonchi, good air movement  CV: HRRR, no peripheral edema - ? Small amount of swelling R foot  MS: moves all extremities without noticeable abnormality  PSYCH: pleasant and cooperative, no obvious depression or anxiety  ASSESSMENT AND PLAN:  Discussed the following assessment and plan:  Eustachian tube disorder, right - Plan: fluticasone (FLONASE) 50 MCG/ACT nasal spray  Essential hypertension, benign - Plan: hydrochlorothiazide (HYDRODIURIL) 25 MG tablet  -BP fine here -advised stopping BB given can cause swelling and tinitus, she doesn't like it, and she currently is single, not trying to get pregnant and on birth control so pregnancy not likely an issue -restart HCTZ  -flonase 2 sprays each nostil daily for next two weeks - if ear symptoms do not improve see ENT - number provided to call -follow up with PCP in 1  month -Patient advised to return or notify a doctor immediately if symptoms worsen or persist or new concerns arise.  Patient Instructions  -flonase 2 sprays each nostril daily for the next 2 weeks  -stop the metoprolol and start the hydrochlorothiazide  -if ringing in ears continues in 2 weeks - see ENT per brochure provided  -follow up with your doctor in 1 month       KIM, [facility] R.

## 2012-12-01 NOTE — Patient Instructions (Signed)
-  flonase 2 sprays each nostril daily for the next 2 weeks  -stop the metoprolol and start the hydrochlorothiazide  -if ringing in ears continues in 2 weeks - see ENT per brochure provided  -follow up with your doctor in 1 month

## 2013-03-02 ENCOUNTER — Telehealth: Payer: Self-pay | Admitting: *Deleted

## 2013-03-02 NOTE — Telephone Encounter (Signed)
Pt called requesting a genetic appt and I confirmed 04/22/13 genetic appt w/ pt.  Requested for her to call [facility] and have them to fax me records since they require a release.  She will do.

## 2013-03-06 ENCOUNTER — Encounter: Payer: Self-pay | Admitting: Family Medicine

## 2013-03-06 ENCOUNTER — Ambulatory Visit (INDEPENDENT_AMBULATORY_CARE_PROVIDER_SITE_OTHER): Payer: BC Managed Care – PPO | Admitting: Family Medicine

## 2013-03-06 VITALS — BP 144/88 | HR 66 | Temp 98.1°F | Wt 137.0 lb

## 2013-03-06 DIAGNOSIS — H9319 Tinnitus, unspecified ear: Secondary | ICD-10-CM

## 2013-03-06 DIAGNOSIS — H9313 Tinnitus, bilateral: Secondary | ICD-10-CM

## 2013-03-06 DIAGNOSIS — R609 Edema, unspecified: Secondary | ICD-10-CM

## 2013-03-06 DIAGNOSIS — R600 Localized edema: Secondary | ICD-10-CM

## 2013-03-06 NOTE — Assessment & Plan Note (Signed)
I am not concerned for cardiac source of chest symptoms.  Would have pt take an extra dose of HCTZ today, call PCP for follow up BP and take her cuff in for calibration.  Elevate feet prn. Limit salt.

## 2013-03-06 NOTE — Progress Notes (Signed)
She had elevated BP before and was on metoprolol.  She was taken off it prev.  Now has been taking HCTZ for a few months.  That had worked well for a period of time, with BP controlled and no edema.    Her R foot tends to get puffy when her pressure goes up.  She has a HA today.  R frontal area.  No FCNAVD.  No vision changes.    She had some mild CP last night, it started about 8PM, was intermittent, would last ~30 sec, self resolved.  Not exertional, not typical chest pain.  No heart racing, she checked her pulse.  Not SOB.  She hadn't had episodes like this prev.   No known salt load in diet, but she isn't on a low salt diet.  Yesterday was a stressful day.  She thinks that stress mgmt is important for her BP control.   Max BPs at home 150s/90s.   Not dizzy on standing recently.    She has had B ear ringing since 1/14.  Was prev on flonase.  No help with nasal steroids.   Meds, vitals, and allergies reviewed.   ROS: See HPI.  Otherwise, noncontributory.  GEN: nad, alert and oriented HEENT: mucous membranes moist, R TM with SOM noted, L TM wnl, nasal and OP exam wnl NECK: supple w/o LA CV: rrr. No murmur PULM: ctab, no inc wob ABD: soft, +bs EXT: no edema SKIN: no acute rash

## 2013-03-06 NOTE — Patient Instructions (Signed)
Take an extra HCTZ today.  Look up the DASH diet (low salt).  Elevate your feet.  Call your PCP about getting your BP rechecked and take your cuff to the clinic. Ask your PCP about the ear ringing.

## 2013-03-06 NOTE — Assessment & Plan Note (Signed)
I would have patient ask PCP about f/u for [facility] and ear ringing.

## 2013-03-08 ENCOUNTER — Telehealth: Payer: Self-pay | Admitting: Family Medicine

## 2013-03-08 NOTE — Telephone Encounter (Signed)
[facility] Triage Call Report Triage Record Num: [id number] Operator: Rebeca Allegra Patient Name: Melanie Carey Call Date & Time: 03/05/2013 10:08:26PM Patient Phone: [phone] PCP: Eugenio Hoes. Todd Patient Gender: Female PCP Fax : [phone] Patient DOB: 1986/04/16 Practice Name: [facility] Reason for Call: Caller: [name]/Patient; PCP: Kelle Darting [facility]); CB#: [phone]; Call regarding Chest Pain/Chest Discomfort; onset 03/05/13 2000-intermittent, sharp pain lasting approx 30sec. Denies chest pain at the moment or within the last hour. 03/05/13 2000 BP: 147/107; 2213: 150/113. Afebrile. All emergent symptoms ruled out per Hypertension, Diagnosed or suspected protocol with the exception of "Multiple elevated blood pressure readings without other symptoms and no previous work up or reading exceed expected range defined by treatment plan.". Appt scheduled for 03/06/13 1045. Protocol(s) Used: Hypertension, Diagnosed or Suspected Recommended Outcome per Protocol: See Provider within 72 Hours Reason for Outcome: Multiple elevated blood pressure readings without other symptoms AND no previous work-up OR readings exceed expected range defined by treatment plan Care Advice: ~ 06/

## 2013-03-23 ENCOUNTER — Encounter: Payer: Self-pay | Admitting: Family Medicine

## 2013-03-23 ENCOUNTER — Ambulatory Visit (INDEPENDENT_AMBULATORY_CARE_PROVIDER_SITE_OTHER): Payer: BC Managed Care – PPO | Admitting: Family Medicine

## 2013-03-23 VITALS — BP 120/78 | HR 72 | Temp 98.3°F | Resp 16 | Ht 68.0 in | Wt 138.0 lb

## 2013-03-23 DIAGNOSIS — R609 Edema, unspecified: Secondary | ICD-10-CM

## 2013-03-23 DIAGNOSIS — Z803 Family history of malignant neoplasm of breast: Secondary | ICD-10-CM

## 2013-03-23 DIAGNOSIS — H8110 Benign paroxysmal vertigo, unspecified ear: Secondary | ICD-10-CM

## 2013-03-23 DIAGNOSIS — I10 Essential (primary) hypertension: Secondary | ICD-10-CM

## 2013-03-23 DIAGNOSIS — R030 Elevated blood-pressure reading, without diagnosis of hypertension: Secondary | ICD-10-CM

## 2013-03-23 DIAGNOSIS — B360 Pityriasis versicolor: Secondary | ICD-10-CM

## 2013-03-23 MED ORDER — PREDNISONE 20 MG PO TABS: ORAL_TABLET | ORAL | Status: DC

## 2013-03-23 MED ORDER — HYDROCHLOROTHIAZIDE 25 MG PO TABS: 25.0000 mg | ORAL_TABLET | Freq: Every day | ORAL | Status: DC

## 2013-03-23 NOTE — Patient Instructions (Signed)
Prednisone 20 mg,,,,,,,,,,,, uses directed to clear the fluid out of her middle ears  Hydrochlorothiazide 25 mg one tablet daily in the morning  Purchase a pump up omron digital blood pressure cuff and check your blood pressure daily in the morning  Return in 6 weeks for followup  Over-the-counter antifungal cream twice daily on the 4 lesions,,,,,,, tinea versicolor

## 2013-03-23 NOTE — Progress Notes (Signed)
  Subjective:    Patient ID: Melanie Carey, female    DOB: 12/27/1985, 27 y.o.   MRN: [id number]  HPI Melanie Carey is a 27 year old female G1 P1 married nonsmoker who comes in today for evaluation of hypertension and peripheral edema  Her BP today is 120/78 hydrochlorothiazide 25 mg daily  She's also on BCPs but declines to stop them.  Her father has a history of hypertension  She is due to see the geneticist about the family history of breast cancer her mom. Last week she had another episode of vertigo. It was fairly mild and resolved after for 5 days.   She also has 4 white lots is on her neck and anterior chest wall. She went to a dermatologist supporter was a fungal infection  Pelvics and Paps by GYN recent done normal  Occupation she works as a Associate Professor   Review of Systems Review of systems otherwise negative    Objective:   Physical Exam Well-developed and nourished female no acute distress BP right arm position 120/80 repeat by me saying her digital cuff is not accurate.  Skin shows 4 white lesions consistent with tinea versicolor rest of her skin exam normal cardiopulmonary exam normal ENT exam normal except she says she has fluid in both ears it will clear with nasal spray       Assessment & Plan:  Mild hypertension continue hydrochlorothiazide 25 mg daily,,,,,,,,, discuss stopping birth control pills she would like not to do that at this juncture.  Benign positional vertigo  Bilateral serous otitis media  Family history of breast cancer mom followup genetic studies as outlined

## 2013-03-24 ENCOUNTER — Ambulatory Visit (HOSPITAL_COMMUNITY)
Admission: RE | Admit: 2013-03-24 | Discharge: 2013-03-24 | Disposition: A | Discharge status: Home / Self Care | Payer: BC Managed Care – PPO | Source: Ambulatory Visit | Attending: Obstetrics and Gynecology | Admitting: Obstetrics and Gynecology

## 2013-03-29 NOTE — Progress Notes (Signed)
Genetic Counseling  Preconception Note  Appointment Date:  03/24/2013 Referred By: Melanie Hawking, MD Date of Birth:  03-Apr-1986 Pregnancy History: G1P1 Attending: Rema Fendt, MD  I met with Mrs. Melanie Carey and her husband for genetic counseling because of a family history of Angelman syndrome.  We began by reviewing the family history in detail. The patient's husband reported that his sister has Angelman syndrome. He reported that his full sister, currently age 39 years old, was diagnosed with Angelman syndrome at approximately age 51 years, following multiple evaluations prior to that time. He reported that she is ambulatory and has absent speech. She currently lives with a caregiver. Limited information was known by the couple regarding where her diagnosis was obtained, if she had been seen by medical genetics in the past and whether or not genetic testing had been performed and identified an underlying genetic mechanism as the cause for Angelman syndrome. No additional relatives were reported with Angelman syndrome.   Angelman syndrome (AS) is a genetic condition characterized by severe developmental delay, lack of speech, gait ataxia, and a happy disposition with frequent laughter. Seizures and microcephaly (small head size) may also be seen in AS. Onset of features of Angelman syndrome typically do not occur prior to 92 months of age and may not be detected until around 28 year of age. It can take several years before the correct clinical diagnosis is apparent.   AS is caused by a missing or nonworking maternal copy of UBE3A gene, located on a region on the long arm of chromosome 15 notated as q11.2-q13.  This condition can be inherited or may occur sporadically by a number of different mechanisms. AS most commonly (approximately 65-75% of the time) occurs due to a deletion of 15q11.2-q13 that is typically sporadic. Approximately 3-7% of cases are due to paternal uniparental disomy  (UPD), which is the inheritance of both copies of chromosome 15 from the father and no copies from the mother. We discussed that rarely (<2% of cases), a parent may carry a chromosome rearrangement, such as a translocation or deletion that may predispose to this deleted region or to paternal UPD.  Approximately 11% of cases of AS are caused by a mutation in UBE3A, which is either sporadic or inherited from the mother of the affected individual. Approximately 3% of cases are due to an imprinting defect, which can be sporadic or due to an inherited deletion in the imprinting center. Additionally, there are approximately 10-15% of individuals with a clinical diagnosis of Angelman syndrome and no underlying identifiable molecular cause. We discussed that accurate recurrence risk estimate for offspring for this couple would depend upon the specific underlying causative mechanism for his sister's Angelman syndrome. We discussed that reviewing her medical records would be most informative, if available. [name] husband planned to talk with his father and contact us if additional information was obtained regarding the specific molecular cause for his sister's Angelman syndrome.   Overall, we discussed that recurrence risk for Angelman syndrome for the couple's offspring is most likely low. We reviewed with the couple that recurrence risk for Angelman syndrome for the patient's husband's offspring would not be expected to be increased in the case of a deletion, UBE3A mutation, UPD, or imprinting defect that were due to sporadic causes. In the case of an inherited UBE3A mutation from the mother as the underlying cause, recurrence risk would also not be expected to be increased for Melanie Carey's husband's offspring, given that he does  not appear to have features of Angelman syndrome. We reviewed the less than 2% of cases of AS that are due to a chromosome rearrangement in the parent that predisposes to an unbalanced  rearrangement or paternal UPD in the offspring. In the case that this were the underlying mechanism for the sister with Angelman syndrome, then recurrence risk to offspring for Melanie Carey's husband's offspring would depend upon his karyotype given that he would only be expected to have an increased risk for recurrence for offspring if he also carried a chromosome rearrangement that may predispose to a deletion in this region or to UPD. We discussed that peripheral blood chromosome analysis is available to the patient's husband, if desired, to assess for an underlying chromosome rearrangement, such as a translocation. He declined chromosome studies at this time.  We briefly discussed available screening and testing for Angelman syndrome in a future pregnancy. For pregnancies known to be at increased risk and when the causative genetic mechanism has previously been identified in the family, prenatal diagnosis via chorionic villus sampling (at 10-[redacted] weeks gestation) or amniocentesis (after [redacted] weeks gestation) would be available. We reviewed the risks, benefits, and limitations of CVS and amniocentesis, including the approximate 1 in 100 risk for complications for CVS and the approximate 1 in 300-500 risk for complications from amniocentesis, including spontaneous pregnancy loss. Additionally, we discussed that noninvasive prenatal screening (NIPS)/cell free fetal DNA testing in pregnancy can screen for Angelman syndrome that is caused by a deletion of region 15q11.2-q13. Thus, this screen would not assess for Angelman syndrome that is due to other mechanisms, such as UPD, UBE3A mutations, or imprinting center defects. Additionally, this screen is not diagnostic for Angelman syndrome but rather provides a pregnancy specific risk assessment.   Additionally, [name] reported a paternal half-uncle (her father's maternal half-brother) who had intellectual disability and was in a wheelchair throughout his lifetime. He  reportedly died in adolescence. Limited information was known regarding the underlying cause for his features. No additional relatives were reported with similar features. [name] was counseled that there are many different causes of intellectual disabilities including environmental, multifactorial, and genetic etiologies.  We discussed that a specific diagnosis for intellectual disability can be determined in approximately 50% of these individuals.  In the remaining 50% of individuals, a diagnosis may never be determined.  Regarding genetic causes, we discussed that chromosome aberrations (aneuploidy, deletions, duplications, insertions, and translocations) are responsible for a small percentage of individuals with intellectual disability.  Many individuals with chromosome aberrations have additional differences, including congenital anomalies or minor dysmorphisms.  Likewise, single gene conditions are the underlying cause of intellectual delay in some families.  We discussed that many gene conditions have intellectual disability as a feature, but also often include other physical or medical differences.  Additionally, there can be many causes that may lead an individual to require use of a wheelchair including genetic, sporadic, environmental, or multifactorial causes. We discussed that without more specific information, it is difficult to provide an accurate risk assessment and specific screening testing prior to or during a pregnancy would not be expected to be informative regarding this family history.   Further genetic counseling is warranted if more information is obtained.  [name] also reported a female paternal cousin (her father's maternal half-sister's daughter) who required heart surgery after birth. She reportedly was also born preterm. This relative is reportedly otherwise healthy. Congenital heart defects occur in approximately 1% of pregnancies.  Congenital heart defects may occur  due to  multifactorial influences, chromosomal abnormalities, genetic syndromes or environmental exposures.  Isolated heart defects are generally multifactorial.  Given the reported family history and assuming multifactorial inheritance, the risk for a congenital heart defect for Melanie Carey's offspring does not appear to be increased above the general population risk.  The patient also reported that her mother died in her early to mid-40's from breast cancer, and the patient reported a female maternal first cousin who died in her 58's from ovarian cancer. [name] reported that her physician is aware of this history. Additionally, the patient reported that she has an upcoming appointment scheduled for genetic counseling at [facility] to discuss this family history and possible available screening in more detail. The family histories were otherwise found to be noncontributory for birth defects, recurrent pregnancy loss, and known genetic conditions. Without further information regarding the provided family history, an accurate genetic risk cannot be calculated. Further genetic counseling is warranted if more information is obtained.  [name] was provided with written information regarding cystic fibrosis (CF) including the carrier frequency and incidence in the Caucasian population, the availability of carrier testing and prenatal diagnosis if indicated.  In addition, we discussed that CF is routinely screened for as part of the [place] newborn screening panel.  She declined testing today.   [name] denied exposure to environmental toxins or chemical agents. She denied the use of alcohol, tobacco or street drugs. Her medical and surgical histories were contributory for high blood pressure, which she reported is treated with medication currently.   I counseled this couple regarding the above risks and available options.  The approximate face-to-face time with the genetic counselor  was 45 minutes.  Quinn Plowman, MS Certified Genetic Counselor 03/29/2013 9:25 AM

## 2013-04-19 ENCOUNTER — Telehealth: Payer: Self-pay | Admitting: *Deleted

## 2013-04-19 NOTE — Telephone Encounter (Signed)
Pt called and left me a voicemail about what records I need because her doctor is charging her per page.  I left her a message to call me back so I can find out who the doctor is and help her with this so she is not charged.

## 2013-04-22 ENCOUNTER — Ambulatory Visit (HOSPITAL_BASED_OUTPATIENT_CLINIC_OR_DEPARTMENT_OTHER): Payer: BC Managed Care – PPO | Admitting: Genetic Counselor

## 2013-04-22 ENCOUNTER — Other Ambulatory Visit: Payer: BC Managed Care – PPO | Admitting: Lab

## 2013-04-22 ENCOUNTER — Encounter: Payer: Self-pay | Admitting: Genetic Counselor

## 2013-04-22 DIAGNOSIS — Z803 Family history of malignant neoplasm of breast: Secondary | ICD-10-CM

## 2013-04-22 DIAGNOSIS — Z8049 Family history of malignant neoplasm of other genital organs: Secondary | ICD-10-CM

## 2013-04-22 NOTE — Progress Notes (Signed)
Dr.  Marcelle Overlie requested a consultation for genetic counseling and risk assessment for Melanie Carey, a 27 y.o. female, for discussion of her family history of breast and uterine cancer.  She presents to clinic today to discuss the possibility of a genetic predisposition to cancer, and to further clarify her risks, as well as her family members' risks for cancer.   HISTORY OF PRESENT ILLNESS: Melanie Carey is a 27 y.o. female with no personal history of cancer.  She has never had a colonoscopy.  History reviewed. No pertinent past medical history.  History reviewed. No pertinent past surgical history.  History   Social History  . Marital Status: Married    Spouse Name: N/A    Number of Children: N/A  . Years of Education: N/A   Social History Main Topics  . Smoking status: Never Smoker   . Smokeless tobacco: None  . Alcohol Use: No  . Drug Use: No  . Sexually Active: None   Other Topics Concern  . None   Social History Narrative  . None    REPRODUCTIVE HISTORY AND PERSONAL RISK ASSESSMENT FACTORS: Menarche was at age 61.   premenopausal Uterus Intact: yes Ovaries Intact: yes G1P1A0, first live birth at age 39  She has not previously undergone treatment for infertility.   Oral Contraceptive use: 11 years   She has not used HRT in the past.    FAMILY HISTORY:  We obtained a detailed, 4-generation family history.  Significant diagnoses are listed below: Family History  Problem Relation Age of Onset  . Breast cancer Mother 38  . Hypertension Father   . Hyperlipidemia Father   . Cancer Cousin     unknown primary  . Uterine cancer Cousin    Patient's maternal ancestors are of American [place] descent, and paternal ancestors are of unknown descent. There is no reported Ashkenazi Jewish ancestry. There is no  known consanguinity.  GENETIC COUNSELING ASSESSMENT: Melanie Carey is a 27 y.o. female with a family history of breast and uterine cancer which  somewhat suggestive of a hereditary cancer syndrome and predisposition to cancer. We, therefore, discussed and recommended the following at today's visit.   DISCUSSION: We reviewed the characteristics, features and inheritance patterns of hereditary cancer syndromes. We also discussed genetic testing, including the appropriate family members to test, the process of testing, insurance coverage and turn-around-time for results. We recommended Melanie Carey pursue genetic testing for breast/ovarian cancer panel at [facility].   PLAN: After considering the risks, benefits, and limitations, Melanie Carey provided informed consent to pursue genetic testing and the blood sample will be sent to [facility] for analysis of the breast/ovarian cancer apnel. We discussed the implications of a positive, negative and/ or variant of uncertain significance genetic test result. Results should be available within approximately 3-4 weeks' time, at which point they will be disclosed by telephone to Melanie Carey, as will any additional her recommendations warranted by these results. Melanie Carey will receive a summary of her genetic counseling visit and a copy of her results once available. This information will also be available in Epic. We encouraged Melanie Carey to remain in contact with cancer genetics annually so that we can continuously update the family history and inform her of any changes in cancer genetics and testing that may be of benefit for her family. Melanie Carey's questions were answered to her satisfaction today. Our contact information was provided should additional  questions or concerns arise.  Per the patient's request, we will contact her by telephone to discuss these results. A follow up genetic counseling visit will be scheduled if indicated.  The patient was seen for a total of 60 minutes, greater than 50% of which was spent face-to-face counseling.  This plan is being  carried out per Dr. Drue Second recommendations.  This note will also be sent to the referring provider via the electronic medical record. The patient will be supplied with a summary of this genetic counseling discussion as well as educational information on the discussed hereditary cancer syndromes following the conclusion of their visit.   Patient was discussed with Dr. Drue Second.   _______________________________________________________________________ For Office Staff:  Number of people involved in session: 1 Was an Intern/ student involved with case: no

## 2013-05-05 ENCOUNTER — Ambulatory Visit: Payer: BC Managed Care – PPO | Admitting: Family Medicine

## 2013-05-11 ENCOUNTER — Encounter: Payer: Self-pay | Admitting: Genetic Counselor

## 2013-05-11 ENCOUNTER — Telehealth: Payer: Self-pay | Admitting: Genetic Counselor

## 2013-05-11 NOTE — Telephone Encounter (Signed)
Revealed negative genetic testing.    

## 2013-05-11 NOTE — Telephone Encounter (Signed)
Patient returned my call and asked that I call back.  I tried to call back but got VM.  Left another message.

## 2013-05-11 NOTE — Telephone Encounter (Signed)
Left good news message and asked that she call back. 

## 2013-06-28 ENCOUNTER — Ambulatory Visit (INDEPENDENT_AMBULATORY_CARE_PROVIDER_SITE_OTHER): Payer: BC Managed Care – PPO | Admitting: Family Medicine

## 2013-06-28 ENCOUNTER — Encounter: Payer: Self-pay | Admitting: Family Medicine

## 2013-06-28 VITALS — BP 124/84 | Temp 98.4°F | Wt 136.0 lb

## 2013-06-28 DIAGNOSIS — I10 Essential (primary) hypertension: Secondary | ICD-10-CM

## 2013-06-28 NOTE — Patient Instructions (Addendum)
Continue medication daily and salt restriction as you currently are doing  Return in July for followup 30 minute appointment

## 2013-06-28 NOTE — Progress Notes (Signed)
  Subjective:    Patient ID: Melanie Carey, female    DOB: 1985-12-16, 27 y.o.   MRN: [id number]  HPI Melanie Carey is a 27 year old female nonsmoker who comes in today for followup of hypertension  We saw her last spring with elevated blood pressures her blood pressure readings and mild hypertension. She was started on hydrochlorothiazide 25 mg daily BP now 130/80. She also takes BCPs and wishes to continue those  As noted previously her mother and father both had hypertension her mother died of breast cancer  Review of Systems    review of systems negative Objective:   Physical Exam Well-developed well-nourished female no acute distress BP right arm sitting position 130/80 pulse 70 and regular       Assessment & Plan:

## 2013-09-07 ENCOUNTER — Telehealth: Payer: Self-pay | Admitting: Family Medicine

## 2013-09-07 NOTE — Telephone Encounter (Signed)
Pt is wanting to get pregnant and would like to know if she should get off hydrochlorothiazide (HYDRODIURIL) 25 MG tablet And switch to something else? Pt would like to make this switch now. pls [facility]. [facility]

## 2013-09-07 NOTE — Telephone Encounter (Signed)
Folic acid and speak with OB/GYN about medications - patient is aware.

## 2013-09-23 NOTE — L&D Delivery Note (Signed)
Delivery Note At 3:20 PM a viable female was delivered via Vaginal, Spontaneous Delivery (Presentation: Right Occiput Anterior).  APGAR: 9 9  Placenta status:spontaneously with 3 vessel cord , .  Cord:  with the following complications: upon delivery of the baby  I inspected the umbilical cord and approximately 1.5 cm from the stump I noticed an opening in the cord.  Bilious fluid spilling out of the opening. 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  Called Neonatal who inspected the cord and agreed.  Recommended clamping cord far away which I did.  The baby did skin to skin and Neonatal wrapped the cord/bowel stump with sterile saline soaked guaze Baby transported to NICU and [facility] .  Cord pH: not obtained  Anesthesia: Epidural  Episiotomy: None Lacerations: none Suture Repair: not necessary Est. Blood Loss (mL):   Mom to postpartum.  Baby to NICU.  Luqman Perrelli L 07/21/2014, 3:45 PM

## 2013-12-02 ENCOUNTER — Other Ambulatory Visit: Payer: Self-pay

## 2013-12-20 LAB — OB RESULTS CONSOLE RUBELLA ANTIBODY, IGM: RUBELLA: IMMUNE

## 2013-12-20 LAB — OB RESULTS CONSOLE GC/CHLAMYDIA
Chlamydia: NEGATIVE
Gonorrhea: NEGATIVE

## 2013-12-20 LAB — OB RESULTS CONSOLE HIV ANTIBODY (ROUTINE TESTING): HIV: NONREACTIVE

## 2013-12-20 LAB — OB RESULTS CONSOLE ANTIBODY SCREEN: Antibody Screen: NEGATIVE

## 2013-12-20 LAB — OB RESULTS CONSOLE HEPATITIS B SURFACE ANTIGEN: Hepatitis B Surface Ag: NEGATIVE

## 2013-12-20 LAB — OB RESULTS CONSOLE RPR: RPR: NONREACTIVE

## 2013-12-20 LAB — OB RESULTS CONSOLE ABO/RH: RH TYPE: POSITIVE

## 2014-03-24 ENCOUNTER — Other Ambulatory Visit (HOSPITAL_COMMUNITY): Payer: Self-pay | Admitting: Obstetrics and Gynecology

## 2014-03-24 DIAGNOSIS — O358XX1 Maternal care for other (suspected) fetal abnormality and damage, fetus 1: Secondary | ICD-10-CM

## 2014-03-24 DIAGNOSIS — Z3689 Encounter for other specified antenatal screening: Secondary | ICD-10-CM

## 2014-03-24 DIAGNOSIS — IMO0001 Reserved for inherently not codable concepts without codable children: Secondary | ICD-10-CM

## 2014-03-28 ENCOUNTER — Ambulatory Visit: Payer: BC Managed Care – PPO | Admitting: Family Medicine

## 2014-04-01 ENCOUNTER — Ambulatory Visit (HOSPITAL_COMMUNITY): Admission: RE | Admit: 2014-04-01 | Payer: 59 | Source: Ambulatory Visit

## 2014-04-01 ENCOUNTER — Other Ambulatory Visit: Payer: Self-pay

## 2014-04-01 ENCOUNTER — Encounter (HOSPITAL_COMMUNITY): Payer: Self-pay

## 2014-04-01 ENCOUNTER — Ambulatory Visit (HOSPITAL_COMMUNITY)
Admission: RE | Admit: 2014-04-01 | Discharge: 2014-04-01 | Disposition: A | Discharge status: Home / Self Care | Payer: 59 | Source: Ambulatory Visit | Attending: Obstetrics and Gynecology | Admitting: Obstetrics and Gynecology

## 2014-04-01 DIAGNOSIS — IMO0001 Reserved for inherently not codable concepts without codable children: Secondary | ICD-10-CM

## 2014-04-01 DIAGNOSIS — Z3689 Encounter for other specified antenatal screening: Secondary | ICD-10-CM

## 2014-04-01 DIAGNOSIS — O4690 Antepartum hemorrhage, unspecified, unspecified trimester: Secondary | ICD-10-CM | POA: Insufficient documentation

## 2014-04-01 DIAGNOSIS — O358XX0 Maternal care for other (suspected) fetal abnormality and damage, not applicable or unspecified: Secondary | ICD-10-CM | POA: Insufficient documentation

## 2014-04-01 DIAGNOSIS — O358XX1 Maternal care for other (suspected) fetal abnormality and damage, fetus 1: Secondary | ICD-10-CM

## 2014-04-01 HISTORY — DX: Essential (primary) hypertension: I10

## 2014-04-12 ENCOUNTER — Telehealth (HOSPITAL_COMMUNITY): Payer: Self-pay | Admitting: MS"

## 2014-04-12 NOTE — Telephone Encounter (Signed)
Called Melanie Carey to discuss her cell free fetal DNA test results.  Melanie Carey had Panorama testing through [facility].  Testing was offered because of abnormal ultrasound findings.   The patient was identified by name and DOB.  We reviewed that these are within normal limits, showing a less than 1 in 10,000 risk for trisomies 21, 18 and 13, and monosomy X (Turner syndrome).  In addition, the risk for triploidy/vanishing twin and sex chromosome trisomies (47,XXX and 47,XXY) was also low risk. We reviewed that this testing identifies > 99% of pregnancies with trisomy 5121, trisomy 10713, sex chromosome trisomies (47,XXX and 47,XXY), and triploidy. The detection rate for trisomy 18 is 96%.  The detection rate for monosomy X is ~92%.  The false positive rate is <0.1% for all conditions. Testing was also consistent with female fetal sex.  The patient did wish to know fetal sex.  She understands that this testing does not identify all genetic conditions.    She inquired about a prenatal consultation with pediatric surgery given the ultrasound finding of omphalocele. She stated that she mentioned this to her OB but has not heard back. I discussed with her that I received an e-mail this morning and see that her OB did make the referral. We will work to schedule [facility]. Explained that the surgeons do not have regularly scheduled appointments but rather will schedule an afternoon to be at [facility] once there are several prenatal patients that need to meet with them. Explained that I will pass along her information to the genetic counselor who coordinates these appointments and she will get a call back regarding the appointment date and time. All questions were answered to her satisfaction, she was encouraged to call with additional questions or concerns.  Quinn PlowmanKaren Teairra Millar, MS Patent attorneyCertified Genetic Counselor

## 2014-05-02 ENCOUNTER — Other Ambulatory Visit (HOSPITAL_COMMUNITY): Payer: Self-pay | Admitting: Obstetrics and Gynecology

## 2014-05-02 DIAGNOSIS — O10019 Pre-existing essential hypertension complicating pregnancy, unspecified trimester: Secondary | ICD-10-CM

## 2014-05-02 DIAGNOSIS — O358XX0 Maternal care for other (suspected) fetal abnormality and damage, not applicable or unspecified: Secondary | ICD-10-CM

## 2014-05-03 ENCOUNTER — Ambulatory Visit (HOSPITAL_COMMUNITY)
Admission: RE | Admit: 2014-05-03 | Discharge: 2014-05-03 | Disposition: A | Discharge status: Home / Self Care | Payer: 59 | Source: Ambulatory Visit | Attending: Obstetrics and Gynecology | Admitting: Obstetrics and Gynecology

## 2014-05-03 ENCOUNTER — Ambulatory Visit (HOSPITAL_COMMUNITY): Payer: 59

## 2014-05-03 VITALS — BP 110/61 | HR 71 | Wt 163.2 lb

## 2014-05-03 DIAGNOSIS — O10912 Unspecified pre-existing hypertension complicating pregnancy, second trimester: Secondary | ICD-10-CM | POA: Insufficient documentation

## 2014-05-03 DIAGNOSIS — O10019 Pre-existing essential hypertension complicating pregnancy, unspecified trimester: Secondary | ICD-10-CM | POA: Insufficient documentation

## 2014-05-03 DIAGNOSIS — O358XX0 Maternal care for other (suspected) fetal abnormality and damage, not applicable or unspecified: Secondary | ICD-10-CM | POA: Diagnosis not present

## 2014-05-03 DIAGNOSIS — Z3689 Encounter for other specified antenatal screening: Secondary | ICD-10-CM | POA: Insufficient documentation

## 2014-06-29 LAB — OB RESULTS CONSOLE GBS: STREP GROUP B AG: POSITIVE

## 2014-07-10 ENCOUNTER — Inpatient Hospital Stay (HOSPITAL_COMMUNITY)
Admission: AD | Admit: 2014-07-10 | Discharge: 2014-07-10 | Disposition: A | Discharge status: Home / Self Care | Payer: 59 | Source: Ambulatory Visit | Attending: Obstetrics and Gynecology | Admitting: Obstetrics and Gynecology

## 2014-07-10 ENCOUNTER — Encounter (HOSPITAL_COMMUNITY): Payer: Self-pay | Admitting: *Deleted

## 2014-07-10 DIAGNOSIS — Z3A37 37 weeks gestation of pregnancy: Secondary | ICD-10-CM | POA: Diagnosis not present

## 2014-07-10 DIAGNOSIS — O471 False labor at or after 37 completed weeks of gestation: Secondary | ICD-10-CM | POA: Diagnosis present

## 2014-07-10 NOTE — Discharge Instructions (Signed)
Fetal Movement Counts °Patient Name: __________________________________________________ Patient Due Date: ____________________ °Performing a fetal movement count is highly recommended in high-risk pregnancies, but it is good for every pregnant woman to do. Your health care provider may ask you to start counting fetal movements at 28 weeks of the pregnancy. Fetal movements often increase: °· After eating a full meal. °· After physical activity. °· After eating or drinking something sweet or cold. °· At rest. °Pay attention to when you feel the baby is most active. This will help you notice a pattern of your baby's sleep and wake cycles and what factors contribute to an increase in fetal movement. It is important to perform a fetal movement count at the same time each day when your baby is normally most active.  °HOW TO COUNT FETAL MOVEMENTS °1. Find a quiet and comfortable area to sit or lie down on your left side. Lying on your left side provides the best blood and oxygen circulation to your baby. °2. Write down the day and time on a sheet of paper or in a journal. °3. Start counting kicks, flutters, swishes, rolls, or jabs in a 2-hour period. You should feel at least 10 movements within 2 hours. °4. If you do not feel 10 movements in 2 hours, wait 2-3 hours and count again. Look for a change in the pattern or not enough counts in 2 hours. °SEEK MEDICAL CARE IF: °· You feel less than 10 counts in 2 hours, tried twice. °· There is no movement in over an hour. °· The pattern is changing or taking longer each day to reach 10 counts in 2 hours. °· You feel the baby is not moving as he or she usually does. °Date: ____________ Movements: ____________ Start time: ____________ Finish time: ____________  °Date: ____________ Movements: ____________ Start time: ____________ Finish time: ____________ °Date: ____________ Movements: ____________ Start time: ____________ Finish time: ____________ °Date: ____________ Movements:  ____________ Start time: ____________ Finish time: ____________ °Date: ____________ Movements: ____________ Start time: ____________ Finish time: ____________ °Date: ____________ Movements: ____________ Start time: ____________ Finish time: ____________ °Date: ____________ Movements: ____________ Start time: ____________ Finish time: ____________ °Date: ____________ Movements: ____________ Start time: ____________ Finish time: ____________  °Date: ____________ Movements: ____________ Start time: ____________ Finish time: ____________ °Date: ____________ Movements: ____________ Start time: ____________ Finish time: ____________ °Date: ____________ Movements: ____________ Start time: ____________ Finish time: ____________ °Date: ____________ Movements: ____________ Start time: ____________ Finish time: ____________ °Date: ____________ Movements: ____________ Start time: ____________ Finish time: ____________ °Date: ____________ Movements: ____________ Start time: ____________ Finish time: ____________ °Date: ____________ Movements: ____________ Start time: ____________ Finish time: ____________  °Date: ____________ Movements: ____________ Start time: ____________ Finish time: ____________ °Date: ____________ Movements: ____________ Start time: ____________ Finish time: ____________ °Date: ____________ Movements: ____________ Start time: ____________ Finish time: ____________ °Date: ____________ Movements: ____________ Start time: ____________ Finish time: ____________ °Date: ____________ Movements: ____________ Start time: ____________ Finish time: ____________ °Date: ____________ Movements: ____________ Start time: ____________ Finish time: ____________ °Date: ____________ Movements: ____________ Start time: ____________ Finish time: ____________  °Date: ____________ Movements: ____________ Start time: ____________ Finish time: ____________ °Date: ____________ Movements: ____________ Start time: ____________ Finish  time: ____________ °Date: ____________ Movements: ____________ Start time: ____________ Finish time: ____________ °Date: ____________ Movements: ____________ Start time: ____________ Finish time: ____________ °Date: ____________ Movements: ____________ Start time: ____________ Finish time: ____________ °Date: ____________ Movements: ____________ Start time: ____________ Finish time: ____________ °Date: ____________ Movements: ____________ Start time: ____________ Finish time: ____________  °Date: ____________ Movements: ____________ Start time: ____________ Finish   time: ____________ °Date: ____________ Movements: ____________ Start time: ____________ Finish time: ____________ °Date: ____________ Movements: ____________ Start time: ____________ Finish time: ____________ °Date: ____________ Movements: ____________ Start time: ____________ Finish time: ____________ °Date: ____________ Movements: ____________ Start time: ____________ Finish time: ____________ °Date: ____________ Movements: ____________ Start time: ____________ Finish time: ____________ °Date: ____________ Movements: ____________ Start time: ____________ Finish time: ____________  °Date: ____________ Movements: ____________ Start time: ____________ Finish time: ____________ °Date: ____________ Movements: ____________ Start time: ____________ Finish time: ____________ °Date: ____________ Movements: ____________ Start time: ____________ Finish time: ____________ °Date: ____________ Movements: ____________ Start time: ____________ Finish time: ____________ °Date: ____________ Movements: ____________ Start time: ____________ Finish time: ____________ °Date: ____________ Movements: ____________ Start time: ____________ Finish time: ____________ °Date: ____________ Movements: ____________ Start time: ____________ Finish time: ____________  °Date: ____________ Movements: ____________ Start time: ____________ Finish time: ____________ °Date: ____________  Movements: ____________ Start time: ____________ Finish time: ____________ °Date: ____________ Movements: ____________ Start time: ____________ Finish time: ____________ °Date: ____________ Movements: ____________ Start time: ____________ Finish time: ____________ °Date: ____________ Movements: ____________ Start time: ____________ Finish time: ____________ °Date: ____________ Movements: ____________ Start time: ____________ Finish time: ____________ °Date: ____________ Movements: ____________ Start time: ____________ Finish time: ____________  °Date: ____________ Movements: ____________ Start time: ____________ Finish time: ____________ °Date: ____________ Movements: ____________ Start time: ____________ Finish time: ____________ °Date: ____________ Movements: ____________ Start time: ____________ Finish time: ____________ °Date: ____________ Movements: ____________ Start time: ____________ Finish time: ____________ °Date: ____________ Movements: ____________ Start time: ____________ Finish time: ____________ °Date: ____________ Movements: ____________ Start time: ____________ Finish time: ____________ °Document Released: 10/09/2006 Document Revised: 01/24/2014 Document Reviewed: 07/06/2012 °ExitCare® Patient Information ©2015 [employer]. This information is not intended to replace advice given to you by your health care provider. Make sure you discuss any questions you have with your health care provider. °Braxton Hicks Contractions °Contractions of the uterus can occur throughout pregnancy. Contractions are not always a sign that you are in labor.  °WHAT ARE BRAXTON HICKS CONTRACTIONS?  °Contractions that occur before labor are called Braxton Hicks contractions, or false labor. Toward the end of pregnancy (32-34 weeks), these contractions can develop more often and may become more forceful. This is not true labor because these contractions do not result in opening (dilatation) and thinning of the cervix. They  are sometimes difficult to tell apart from true labor because these contractions can be forceful and people have different pain tolerances. You should not feel embarrassed if you go to the hospital with false labor. Sometimes, the only way to tell if you are in true labor is for your health care provider to look for changes in the cervix. °If there are no prenatal problems or other health problems associated with the pregnancy, it is completely safe to be sent home with false labor and await the onset of true labor. °HOW CAN YOU TELL THE DIFFERENCE BETWEEN TRUE AND FALSE LABOR? °False Labor °· The contractions of false labor are usually shorter and not as hard as those of true labor.   °· The contractions are usually irregular.   °· The contractions are often felt in the front of the lower abdomen and in the groin.   °· The contractions may go away when you walk around or change positions while lying down.   °· The contractions get weaker and are shorter lasting as time goes on.   °· The contractions do not usually become progressively stronger, regular, and closer together as with true labor.   °True Labor °· Contractions in true labor last 30-70 seconds, become   very regular, usually become more intense, and increase in frequency.   °· The contractions do not go away with walking.   °· The discomfort is usually felt in the top of the uterus and spreads to the lower abdomen and low back.   °· True labor can be determined by your health care provider with an exam. This will show that the cervix is dilating and getting thinner.   °WHAT TO REMEMBER °· Keep up with your usual exercises and follow other instructions given by your health care provider.   °· Take medicines as directed by your health care provider.   °· Keep your regular prenatal appointments.   °· Eat and drink lightly if you think you are going into labor.   °· If Braxton Hicks contractions are making you uncomfortable:   °¨ Change your position from  lying down or resting to walking, or from walking to resting.   °¨ Sit and rest in a tub of warm water.   °¨ Drink 2-3 glasses of water. Dehydration may cause these contractions.   °¨ Do slow and deep breathing several times an hour.   °WHEN SHOULD I SEEK IMMEDIATE MEDICAL CARE? °Seek immediate medical care if: °· Your contractions become stronger, more regular, and closer together.   °· You have fluid leaking or gushing from your vagina.   °· You have a fever.   °· You pass blood-tinged mucus.   °· You have vaginal bleeding.   °· You have continuous abdominal pain.   °· You have low back pain that you never had before.   °· You feel your baby's head pushing down and causing pelvic pressure.   °· Your baby is not moving as much as it used to.   °Document Released: 09/09/2005 Document Revised: 09/14/2013 Document Reviewed: 06/21/2013 °ExitCare® Patient Information ©2015 [employer]. This information is not intended to replace advice given to you by your health care provider. Make sure you discuss any questions you have with your health care provider. ° °

## 2014-07-10 NOTE — MAU Note (Signed)
Regular contractions since this pm. Denies bleeding or ROM

## 2014-07-10 NOTE — Progress Notes (Signed)
Dr. Vincente PoliGrewal notified of pt status. Pt to be discharged home w/labor precautions. Pt to take BP meds when she gets home.

## 2014-07-20 ENCOUNTER — Encounter (HOSPITAL_COMMUNITY): Payer: Self-pay | Admitting: *Deleted

## 2014-07-20 ENCOUNTER — Inpatient Hospital Stay (HOSPITAL_COMMUNITY)
Admission: AD | Admit: 2014-07-20 | Discharge: 2014-07-20 | Disposition: A | Discharge status: Home / Self Care | Payer: 59 | Source: Ambulatory Visit | Attending: Obstetrics and Gynecology | Admitting: Obstetrics and Gynecology

## 2014-07-20 DIAGNOSIS — Z3A38 38 weeks gestation of pregnancy: Secondary | ICD-10-CM | POA: Insufficient documentation

## 2014-07-20 DIAGNOSIS — O471 False labor at or after 37 completed weeks of gestation: Secondary | ICD-10-CM | POA: Insufficient documentation

## 2014-07-20 NOTE — MAU Note (Signed)
Pt states her membranes were sripped in office yesterday by Dr. Vincente PoliGrewal.

## 2014-07-20 NOTE — Discharge Instructions (Signed)
Braxton Hicks Contractions °Contractions of the uterus can occur throughout pregnancy. Contractions are not always a sign that you are in labor.  °WHAT ARE BRAXTON HICKS CONTRACTIONS?  °Contractions that occur before labor are called Braxton Hicks contractions, or false labor. Toward the end of pregnancy (32-34 weeks), these contractions can develop more often and may become more forceful. This is not true labor because these contractions do not result in opening (dilatation) and thinning of the cervix. They are sometimes difficult to tell apart from true labor because these contractions can be forceful and people have different pain tolerances. You should not feel embarrassed if you go to the hospital with false labor. Sometimes, the only way to tell if you are in true labor is for your health care provider to look for changes in the cervix. °If there are no prenatal problems or other health problems associated with the pregnancy, it is completely safe to be sent home with false labor and await the onset of true labor. °HOW CAN YOU TELL THE DIFFERENCE BETWEEN TRUE AND FALSE LABOR? °False Labor °· The contractions of false labor are usually shorter and not as hard as those of true labor.   °· The contractions are usually irregular.   °· The contractions are often felt in the front of the lower abdomen and in the groin.   °· The contractions may go away when you walk around or change positions while lying down.   °· The contractions get weaker and are shorter lasting as time goes on.   °· The contractions do not usually become progressively stronger, regular, and closer together as with true labor.   °True Labor °· Contractions in true labor last 30-70 seconds, become very regular, usually become more intense, and increase in frequency.   °· The contractions do not go away with walking.   °· The discomfort is usually felt in the top of the uterus and spreads to the lower abdomen and low back.   °· True labor can be  determined by your health care provider with an exam. This will show that the cervix is dilating and getting thinner.   °WHAT TO REMEMBER °· Keep up with your usual exercises and follow other instructions given by your health care provider.   °· Take medicines as directed by your health care provider.   °· Keep your regular prenatal appointments.   °· Eat and drink lightly if you think you are going into labor.   °· If Braxton Hicks contractions are making you uncomfortable:   °¨ Change your position from lying down or resting to walking, or from walking to resting.   °¨ Sit and rest in a tub of warm water.   °¨ Drink 2-3 glasses of water. Dehydration may cause these contractions.   °¨ Do slow and deep breathing several times an hour.   °WHEN SHOULD I SEEK IMMEDIATE MEDICAL CARE? °Seek immediate medical care if: °· Your contractions become stronger, more regular, and closer together.   °· You have fluid leaking or gushing from your vagina.   °· You have a fever.    °· You have vaginal bleeding.   °· You have continuous abdominal pain.   °· You have low back pain that you never had before.   °· You feel your baby's head pushing down and causing pelvic pressure.   °· Your baby is not moving as much as it used to.   °Document Released: 09/09/2005 Document Revised: 09/14/2013 Document Reviewed: 06/21/2013 °ExitCare® Patient Information ©2015 [employer]. This information is not intended to replace advice given to you by your health care provider. Make sure you discuss any   questions you have with your health care provider. ° °

## 2014-07-20 NOTE — MAU Note (Signed)
Pt woke up having uc's around 0345, have become more intense & frequent.  Denies bleeding, does have brown DC.  No LOF.

## 2014-07-21 ENCOUNTER — Inpatient Hospital Stay (HOSPITAL_COMMUNITY): Payer: 59 | Admitting: Anesthesiology

## 2014-07-21 ENCOUNTER — Encounter (HOSPITAL_COMMUNITY): Payer: 59 | Admitting: Anesthesiology

## 2014-07-21 ENCOUNTER — Inpatient Hospital Stay (HOSPITAL_COMMUNITY)
Admission: AD | Admit: 2014-07-21 | Discharge: 2014-07-21 | DRG: 775 | Disposition: A | Discharge status: Home / Self Care | Payer: 59 | Source: Ambulatory Visit | Attending: Obstetrics and Gynecology | Admitting: Obstetrics and Gynecology

## 2014-07-21 ENCOUNTER — Encounter (HOSPITAL_COMMUNITY): Payer: Self-pay | Admitting: *Deleted

## 2014-07-21 DIAGNOSIS — Z3A38 38 weeks gestation of pregnancy: Secondary | ICD-10-CM | POA: Diagnosis present

## 2014-07-21 DIAGNOSIS — O99824 Streptococcus B carrier state complicating childbirth: Secondary | ICD-10-CM | POA: Diagnosis present

## 2014-07-21 DIAGNOSIS — I10 Essential (primary) hypertension: Secondary | ICD-10-CM

## 2014-07-21 DIAGNOSIS — Z8249 Family history of ischemic heart disease and other diseases of the circulatory system: Secondary | ICD-10-CM

## 2014-07-21 DIAGNOSIS — Z803 Family history of malignant neoplasm of breast: Secondary | ICD-10-CM

## 2014-07-21 DIAGNOSIS — R600 Localized edema: Secondary | ICD-10-CM

## 2014-07-21 DIAGNOSIS — O10912 Unspecified pre-existing hypertension complicating pregnancy, second trimester: Secondary | ICD-10-CM

## 2014-07-21 DIAGNOSIS — B360 Pityriasis versicolor: Secondary | ICD-10-CM

## 2014-07-21 DIAGNOSIS — O471 False labor at or after 37 completed weeks of gestation: Secondary | ICD-10-CM | POA: Diagnosis present

## 2014-07-21 DIAGNOSIS — R609 Edema, unspecified: Secondary | ICD-10-CM

## 2014-07-21 LAB — TYPE AND SCREEN
ABO/RH(D): O POS
ANTIBODY SCREEN: NEGATIVE

## 2014-07-21 LAB — CBC
HCT: 34.4 % — ABNORMAL LOW (ref 36.0–46.0)
HCT: 34 % — ABNORMAL LOW (ref 36.0–46.0)
HEMOGLOBIN: 12.3 g/dL (ref 12.0–15.0)
Hemoglobin: 12.2 g/dL (ref 12.0–15.0)
MCH: 28.2 pg (ref 26.0–34.0)
MCH: 28.4 pg (ref 26.0–34.0)
MCHC: 35.8 g/dL (ref 30.0–36.0)
MCHC: 35.9 g/dL (ref 30.0–36.0)
MCV: 78.9 fL (ref 78.0–100.0)
MCV: 79.1 fL (ref 78.0–100.0)
Platelets: 206 10*3/uL (ref 150–400)
Platelets: 198 10*3/uL (ref 150–400)
RBC: 4.36 MIL/uL (ref 3.87–5.11)
RBC: 4.3 MIL/uL (ref 3.87–5.11)
RDW: 13.7 % (ref 11.5–15.5)
RDW: 13.7 % (ref 11.5–15.5)
WBC: 10.4 10*3/uL (ref 4.0–10.5)
WBC: 16.6 10*3/uL — AB (ref 4.0–10.5)

## 2014-07-21 LAB — ABO/RH: ABO/RH(D): O POS

## 2014-07-21 LAB — RPR

## 2014-07-21 MED ORDER — OXYCODONE-ACETAMINOPHEN 5-325 MG PO TABS
2.0000 | ORAL_TABLET | ORAL | Status: DC | PRN
Start: 2014-07-21 — End: 2014-07-21

## 2014-07-21 MED ORDER — ONDANSETRON HCL 4 MG/2ML IJ SOLN: 4.0000 mg | Freq: Four times a day (QID) | INTRAMUSCULAR | Status: DC | PRN

## 2014-07-21 MED ORDER — OXYCODONE-ACETAMINOPHEN 5-325 MG PO TABS: 1.0000 | ORAL_TABLET | ORAL | Status: DC | PRN

## 2014-07-21 MED ORDER — OXYTOCIN BOLUS FROM INFUSION
500.0000 mL | INTRAVENOUS | Status: DC
  Administered 2014-07-21: 500 mL via INTRAVENOUS

## 2014-07-21 MED ORDER — ONDANSETRON HCL 4 MG/2ML IJ SOLN: 4.0000 mg | INTRAMUSCULAR | Status: DC | PRN

## 2014-07-21 MED ORDER — LABETALOL HCL 100 MG PO TABS
100.0000 mg | ORAL_TABLET | ORAL | Status: AC
  Administered 2014-07-21: 100 mg via ORAL
  Filled 2014-07-21: qty 1

## 2014-07-21 MED ORDER — OXYTOCIN 40 UNITS IN LACTATED RINGERS INFUSION - SIMPLE MED
62.5000 mL/h | INTRAVENOUS | Status: DC
  Administered 2014-07-21: 62.5 mL/h via INTRAVENOUS
  Filled 2014-07-21: qty 1000

## 2014-07-21 MED ORDER — PRENATAL MULTIVITAMIN CH: 1.0000 | ORAL_TABLET | Freq: Every day | ORAL | Status: DC

## 2014-07-21 MED ORDER — DIPHENHYDRAMINE HCL 50 MG/ML IJ SOLN: 12.5000 mg | INTRAMUSCULAR | Status: DC | PRN

## 2014-07-21 MED ORDER — BENZOCAINE-MENTHOL 20-0.5 % EX AERO
1.0000 "application " | INHALATION_SPRAY | CUTANEOUS | Status: DC | PRN
  Administered 2014-07-21: 1 via TOPICAL
  Filled 2014-07-21: qty 56

## 2014-07-21 MED ORDER — ONDANSETRON HCL 4 MG PO TABS: 4.0000 mg | ORAL_TABLET | ORAL | Status: DC | PRN

## 2014-07-21 MED ORDER — WITCH HAZEL-GLYCERIN EX PADS
1.0000 "application " | MEDICATED_PAD | CUTANEOUS | Status: DC | PRN
  Administered 2014-07-21: 1 via TOPICAL

## 2014-07-21 MED ORDER — PENICILLIN G POTASSIUM 5000000 UNITS IJ SOLR
5.0000 10*6.[IU] | Freq: Once | INTRAVENOUS | Status: AC
  Administered 2014-07-21: 5 10*6.[IU] via INTRAVENOUS
  Filled 2014-07-21: qty 5

## 2014-07-21 MED ORDER — LIDOCAINE HCL (PF) 1 % IJ SOLN
30.0000 mL | INTRAMUSCULAR | Status: DC | PRN
  Filled 2014-07-21: qty 30

## 2014-07-21 MED ORDER — EPHEDRINE 5 MG/ML INJ: 10.0000 mg | INTRAVENOUS | Status: DC | PRN

## 2014-07-21 MED ORDER — LIDOCAINE HCL (PF) 1 % IJ SOLN
INTRAMUSCULAR | Status: DC | PRN
  Administered 2014-07-21: 6 mL
  Administered 2014-07-21: 4 mL

## 2014-07-21 MED ORDER — FENTANYL 2.5 MCG/ML BUPIVACAINE 1/10 % EPIDURAL INFUSION (WH - ANES)
14.0000 mL/h | INTRAMUSCULAR | Status: DC | PRN
  Administered 2014-07-21: 14 mL/h via EPIDURAL

## 2014-07-21 MED ORDER — ACETAMINOPHEN 325 MG PO TABS: 650.0000 mg | ORAL_TABLET | ORAL | Status: DC | PRN

## 2014-07-21 MED ORDER — MEASLES, MUMPS & RUBELLA VAC ~~LOC~~ INJ: 0.5000 mL | INJECTION | Freq: Once | SUBCUTANEOUS | Status: DC

## 2014-07-21 MED ORDER — BISACODYL 10 MG RE SUPP
10.0000 mg | Freq: Every day | RECTAL | Status: DC | PRN
  Filled 2014-07-21: qty 1

## 2014-07-21 MED ORDER — DIPHENHYDRAMINE HCL 25 MG PO CAPS: 25.0000 mg | ORAL_CAPSULE | Freq: Four times a day (QID) | ORAL | Status: DC | PRN

## 2014-07-21 MED ORDER — IBUPROFEN 600 MG PO TABS
600.0000 mg | ORAL_TABLET | Freq: Four times a day (QID) | ORAL | Status: DC
Start: 2014-07-21 — End: 2014-07-21
  Administered 2014-07-21: 600 mg via ORAL
  Filled 2014-07-21 (×2): qty 1

## 2014-07-21 MED ORDER — FLEET ENEMA 7-19 GM/118ML RE ENEM
1.0000 | ENEMA | Freq: Every day | RECTAL | Status: DC | PRN
Start: 2014-07-21 — End: 2014-07-21

## 2014-07-21 MED ORDER — PENICILLIN G POTASSIUM 5000000 UNITS IJ SOLR
2.5000 10*6.[IU] | INTRAVENOUS | Status: DC
  Filled 2014-07-21 (×4): qty 2.5

## 2014-07-21 MED ORDER — FENTANYL 2.5 MCG/ML BUPIVACAINE 1/10 % EPIDURAL INFUSION (WH - ANES)
INTRAMUSCULAR | Status: AC
  Administered 2014-07-21: 14 mL/h via EPIDURAL
  Filled 2014-07-21: qty 125

## 2014-07-21 MED ORDER — SIMETHICONE 80 MG PO CHEW: 80.0000 mg | CHEWABLE_TABLET | ORAL | Status: DC | PRN

## 2014-07-21 MED ORDER — FENTANYL 2.5 MCG/ML BUPIVACAINE 1/10 % EPIDURAL INFUSION (WH - ANES): 14.0000 mL/h | INTRAMUSCULAR | Status: DC | PRN

## 2014-07-21 MED ORDER — PHENYLEPHRINE 40 MCG/ML (10ML) SYRINGE FOR IV PUSH (FOR BLOOD PRESSURE SUPPORT): 80.0000 ug | PREFILLED_SYRINGE | INTRAVENOUS | Status: DC | PRN

## 2014-07-21 MED ORDER — LANOLIN HYDROUS EX OINT: TOPICAL_OINTMENT | CUTANEOUS | Status: DC | PRN

## 2014-07-21 MED ORDER — LACTATED RINGERS IV SOLN: 500.0000 mL | INTRAVENOUS | Status: DC | PRN

## 2014-07-21 MED ORDER — FLEET ENEMA 7-19 GM/118ML RE ENEM: 1.0000 | ENEMA | RECTAL | Status: DC | PRN

## 2014-07-21 MED ORDER — TETANUS-DIPHTH-ACELL PERTUSSIS 5-2.5-18.5 LF-MCG/0.5 IM SUSP: 0.5000 mL | Freq: Once | INTRAMUSCULAR | Status: DC

## 2014-07-21 MED ORDER — MEDROXYPROGESTERONE ACETATE 150 MG/ML IM SUSP: 150.0000 mg | INTRAMUSCULAR | Status: DC | PRN

## 2014-07-21 MED ORDER — PHENYLEPHRINE 40 MCG/ML (10ML) SYRINGE FOR IV PUSH (FOR BLOOD PRESSURE SUPPORT)
PREFILLED_SYRINGE | INTRAVENOUS | Status: AC
  Filled 2014-07-21: qty 10

## 2014-07-21 MED ORDER — ZOLPIDEM TARTRATE 5 MG PO TABS: 5.0000 mg | ORAL_TABLET | Freq: Every evening | ORAL | Status: DC | PRN

## 2014-07-21 MED ORDER — DIBUCAINE 1 % RE OINT
1.0000 "application " | TOPICAL_OINTMENT | RECTAL | Status: DC | PRN
  Filled 2014-07-21: qty 28

## 2014-07-21 MED ORDER — LACTATED RINGERS IV SOLN
INTRAVENOUS | Status: DC
  Administered 2014-07-21: 11:00:00 via INTRAVENOUS

## 2014-07-21 MED ORDER — LACTATED RINGERS IV SOLN
500.0000 mL | Freq: Once | INTRAVENOUS | Status: AC
  Administered 2014-07-21: 500 mL via INTRAVENOUS

## 2014-07-21 MED ORDER — CITRIC ACID-SODIUM CITRATE 334-500 MG/5ML PO SOLN: 30.0000 mL | ORAL | Status: DC | PRN

## 2014-07-21 MED ORDER — SENNOSIDES-DOCUSATE SODIUM 8.6-50 MG PO TABS: 2.0000 | ORAL_TABLET | ORAL | Status: DC

## 2014-07-21 NOTE — Progress Notes (Signed)
Discharge instructions given and reviewed with patient. She verbalized understanding.

## 2014-07-21 NOTE — Anesthesia Procedure Notes (Signed)

## 2014-07-21 NOTE — H&P (Signed)
Melanie Carey is a 28 y.o. G 2 P 1 at 7238 w 6 days presented from office 5 to 6 cm dilated.  PNC significant for GBBS + Also ? Umbilical hernia - evaluated by MFM and pediatric surgery. Maternal Medical History:  Reason for admission: Contractions.   Contractions: Onset was 6-12 hours ago.   Frequency: regular.   Perceived severity is moderate.    Fetal activity: Perceived fetal activity is normal.      OB History   Grav Para Term Preterm Abortions TAB SAB Ect Mult Living   2 1 1  0 0 0 0 0 0 1     Past Medical History  Diagnosis Date  . Hypertension    Past Surgical History  Procedure Laterality Date  . Wisdom tooth extraction     Family History: family history includes Breast cancer (age of onset: 143) in her mother; Cancer in her cousin; Hyperlipidemia in her father; Hypertension in her father; Uterine cancer in her cousin. Social History:  reports that she has never smoked. She does not have any smokeless tobacco history on file. She reports that she does not drink alcohol or use illicit drugs.   Prenatal Transfer Tool  Maternal Diabetes: No Genetic Screening: Normal Maternal Ultrasounds/Referrals: Abnormal:  Findings:   Other: umbilical hernia Fetal Ultrasounds or other Referrals:  Referred to Materal Fetal Medicine  Maternal Substance Abuse:  No Significant Maternal Medications:  None Significant Maternal Lab Results:  None Other Comments:  None  Review of Systems  All other systems reviewed and are negative.     Blood pressure 115/68, pulse 65, temperature 98.1 F (36.7 C), temperature source Oral, resp. rate 20, last menstrual period 10/22/2013, SpO2 100.00%. Maternal Exam:  Uterine Assessment: Contraction strength is moderate.  Contraction frequency is regular.   Abdomen: Fetal presentation: vertex     Fetal Exam Fetal State Assessment: Category I - tracings are normal.     Physical Exam  Nursing note and vitals reviewed. Constitutional: She  appears well-developed.  HENT:  Head: Normocephalic.  Eyes: Pupils are equal, round, and reactive to light.  Neck: Normal range of motion.  Cardiovascular: Normal rate and regular rhythm.   Respiratory: Effort normal.  GI: Soft.    Prenatal labs: ABO, Rh: --/--/O POS (10/29 1115) Antibody: PENDING (10/29 1115) Rubella: Immune (03/30 0000) RPR: Nonreactive (03/30 0000)  HBsAg: Negative (03/30 0000)  HIV: Non-reactive (03/30 0000)  GBS: Positive (10/07 0000)   Assessment/Plan: IUP at term Labor GBBS +  Anticipate nsvd Antibiotics for GBSS + Jubilee Vivero L 07/21/2014, 1:27 PM

## 2014-07-21 NOTE — Progress Notes (Signed)
Patient discharged home with friend via wheelchair. VSS, Bleeding WNL.

## 2014-07-21 NOTE — Lactation Note (Signed)
This note was copied from the chart of Melanie Hazeline JunkerLindsay Reep. 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 when stabilized) LC was called by RN staff in L+D to see if this mom wants to express her milk and save for her baby.  LC arrived to find mom and FOB calmly together after delivery.  They informed LC that baby was currently being stabilized in NICU and would not be fed anything for a while.  Parents both state they aren't sure if they will pump for this baby.  LC provided [facility] Resource brochure and encouraged mom to [facility] as needed while here and/or discuss options for pumping and storing ebm with an [facility], if she makes decision later.  LC offered to assist her to initiate pumping as soon as possible as the ideal recommendation for optimum milk supply but also informed parents that any milk, provided at any time, would be beneficial to their baby as he recovers post-surgery.  LC also stated that ebm can be saved and frozen for future use, even if baby is not fed for a while.   Maternal Data Formula Feeding for Exclusion: Yes Reason for exclusion: Admission to Intensive Care Unit (ICU) post-partum  Feeding    LATCH Score/Interventions           N/A - baby in NICU           Lactation Tools Discussed/Used   DEBP and pump initiation/rental options Benefits of ebm for their special-needs infant  Consult Status Consult Status: PRN    Lynda RainwaterBryant, Aashika Carta [place] 07/21/2014, 5:01 PM

## 2014-07-21 NOTE — Anesthesia Preprocedure Evaluation (Signed)
Anesthesia Evaluation  Patient identified by MRN, date of birth, ID band Patient awake    Reviewed: Allergy & Precautions, H&P , Patient's Chart, lab work & pertinent test results  Airway Mallampati: II  TM Distance: >3 FB Neck ROM: full    Dental  (+) Teeth Intact   Pulmonary  breath sounds clear to auscultation        Cardiovascular hypertension, On Medications Rhythm:regular Rate:Normal     Neuro/Psych    GI/Hepatic   Endo/Other    Renal/GU      Musculoskeletal   Abdominal   Peds  Hematology   Anesthesia Other Findings       Reproductive/Obstetrics (+) Pregnancy                             Anesthesia Physical Anesthesia Plan  ASA: II  Anesthesia Plan: Epidural   Post-op Pain Management:    Induction:   Airway Management Planned:   Additional Equipment:   Intra-op Plan:   Post-operative Plan:   Informed Consent: I have reviewed the patients History and Physical, chart, labs and discussed the procedure including the risks, benefits and alternatives for the proposed anesthesia with the patient or authorized representative who has indicated his/her understanding and acceptance.   Dental Advisory Given  Plan Discussed with:   Anesthesia Plan Comments: (Labs checked- platelets confirmed with RN in room. Fetal heart tracing, per RN, reported to be stable enough for sitting procedure. Discussed epidural, and patient consents to the procedure:  included risk of possible headache,backache, failed block, allergic reaction, and nerve injury. This patient was asked if she had any questions or concerns before the procedure started.)        Anesthesia Quick Evaluation  

## 2014-07-22 NOTE — Discharge Summary (Signed)
Obstetric Discharge Summary Reason for Admission: onset of labor Prenatal Procedures: none Intrapartum Procedures: spontaneous vaginal delivery Postpartum Procedures: none Complications-Operative and Postpartum: none Hemoglobin  Date Value Ref Range Status  07/21/2014 12.2  12.0 - 15.0 g/dL Final     HCT  Date Value Ref Range Status  07/21/2014 34.0* 36.0 - 46.0 % Final    Physical Exam:  General: alert, cooperative and appears stated age 26Lochia: appropriate Uterine Fundus: firm Incision: healing well, no significant drainage DVT Evaluation: No evidence of DVT seen on physical exam.  Discharge Diagnoses: Term Pregnancy-delivered  Discharge Information: Date: 07/22/2014 Activity: pelvic rest Diet: routine Medications: None Condition: stable Instructions: refer to practice specific booklet Discharge to: home   Newborn Data: Live born female  Birth Weight: 6 lb 10.9 oz (3030 g) APGAR: 9, 9  Home with [facility].  Melanie Carey 07/22/2014, 12:29 AM

## 2014-07-25 ENCOUNTER — Encounter (HOSPITAL_COMMUNITY): Payer: Self-pay | Admitting: *Deleted

## 2014-07-25 NOTE — Anesthesia Postprocedure Evaluation (Signed)
  Anesthesia Post-op Note  Patient: Melanie MellowLindsay C Nalepa This patient has recovered from her labor epidural, and I am not aware of any complications or problems. I saw her just prior to her leaving the hospital.

## 2014-10-03 ENCOUNTER — Ambulatory Visit: Payer: Self-pay | Admitting: Family Medicine

## 2014-10-07 ENCOUNTER — Ambulatory Visit (INDEPENDENT_AMBULATORY_CARE_PROVIDER_SITE_OTHER): Payer: BLUE CROSS/BLUE SHIELD | Admitting: Family Medicine

## 2014-10-07 ENCOUNTER — Encounter: Payer: Self-pay | Admitting: Family Medicine

## 2014-10-07 VITALS — BP 130/80 | HR 71 | Temp 98.1°F | Wt 159.0 lb

## 2014-10-07 DIAGNOSIS — R42 Dizziness and giddiness: Secondary | ICD-10-CM

## 2014-10-07 NOTE — Patient Instructions (Signed)
Vertigo Vertigo means you feel like you or your surroundings are moving when they are not. Vertigo can be dangerous if it occurs when you are at work, driving, or performing difficult activities.  CAUSES  Vertigo occurs when there is a conflict of signals sent to your brain from the visual and sensory systems in your body. There are many different causes of vertigo, including:  Infections, especially in the inner ear.  A bad reaction to a drug or misuse of alcohol and medicines.  Withdrawal from drugs or alcohol.  Rapidly changing positions, such as lying down or rolling over in bed.  A migraine headache.  Decreased blood flow to the brain.  Increased pressure in the brain from a head injury, infection, tumor, or bleeding. SYMPTOMS  You may feel as though the world is spinning around or you are falling to the ground. Because your balance is upset, vertigo can cause nausea and vomiting. You may have involuntary eye movements (nystagmus). DIAGNOSIS  Vertigo is usually diagnosed by physical exam. If the cause of your vertigo is unknown, your caregiver may perform imaging tests, such as an MRI scan (magnetic resonance imaging). TREATMENT  Most cases of vertigo resolve on their own, without treatment. Depending on the cause, your caregiver may prescribe certain medicines. If your vertigo is related to body position issues, your caregiver may recommend movements or procedures to correct the problem. In rare cases, if your vertigo is caused by certain inner ear problems, you may need surgery. HOME CARE INSTRUCTIONS   Follow your caregiver's instructions.  Avoid driving.  Avoid operating heavy machinery.  Avoid performing any tasks that would be dangerous to you or others during a vertigo episode.  Tell your caregiver if you notice that certain medicines seem to be causing your vertigo. Some of the medicines used to treat vertigo episodes can actually make them worse in some people. SEEK  IMMEDIATE MEDICAL CARE IF:   Your medicines do not relieve your vertigo or are making it worse.  You develop problems with talking, walking, weakness, or using your arms, hands, or legs.  You develop severe headaches.  Your nausea or vomiting continues or gets worse.  You develop visual changes.  A family member notices behavioral changes.  Your condition gets worse. MAKE SURE YOU:  Understand these instructions.  Will watch your condition.  Will get help right away if you are not doing well or get worse. Document Released: 06/19/2005 Document Revised: 12/02/2011 Document Reviewed: 03/28/2011 [facility] Patient Information 2015 [place] [place]. This information is not intended to replace advice given to you by your health care provider. Make sure you discuss any questions you have with your health care provider.   Try over the counter Flonase or Nasacort and would also take the Zyrtec Let me know by next week if symptoms not improving.

## 2014-10-07 NOTE — Progress Notes (Signed)
Pre visit review using our clinic review tool, if applicable. No additional management support is needed unless otherwise documented below in the visit note. 

## 2014-10-07 NOTE — Progress Notes (Signed)
   Subjective:    Patient ID: Melanie Carey, female    DOB: 05/10/1986, 29 y.o.   MRN: [id number]  HPI   Patient seen with dizziness. This is more like vertigo by description. She's had about 2 weeks now of mostly constant vertigo. She's had similar symptoms in the past though not quite this duration. [facility] told she had" fluid" behind both ears. She was treated with prednisone for 1 week. Her symptoms are worse at night. She's not any focal weakness. No headaches. No sudden hearing changes. No fevers or chills. Does have some nasal congestive symptoms. Mostly clear mucus. No focal weakness. No syncopal or presyncopal symptoms  Past Medical History  Diagnosis Date  . Hypertension    Past Surgical History  Procedure Laterality Date  . Wisdom tooth extraction      reports that she has never smoked. She does not have any smokeless tobacco history on file. She reports that she does not drink alcohol or use illicit drugs. family history includes Breast cancer (age of onset: 2743) in her mother; Cancer in her cousin; Hyperlipidemia in her father; Hypertension in her father; Uterine cancer in her cousin. Allergies  Allergen Reactions  . Vicodin [Hydrocodone-Acetaminophen] Nausea And Vomiting      Review of Systems  Constitutional: Negative for fatigue.  Eyes: Negative for visual disturbance.  Respiratory: Negative for cough, chest tightness, shortness of breath and wheezing.   Cardiovascular: Negative for chest pain, palpitations and leg swelling.  Neurological: Positive for dizziness. Negative for seizures, syncope, weakness, light-headedness and headaches.       Objective:   Physical Exam  Constitutional: She is oriented to person, place, and time. She appears well-developed and well-nourished.  HENT:  Right Ear: External ear normal.  Left Ear: External ear normal.  Mouth/Throat: Oropharynx is clear and moist.  Neck: Neck supple. No  thyromegaly present.  Cardiovascular: Normal rate and regular rhythm.   Pulmonary/Chest: Effort normal and breath sounds normal. No respiratory distress. She has no wheezes. She has no rales.  Musculoskeletal: She exhibits no edema.  Neurological: She is alert and oriented to person, place, and time. No cranial nerve deficit. Coordination normal.  Normal coordination by finger-to-nose testing. Romberg is normal. Full-strength throughout          Assessment & Plan:  Vertigo. Possibly related to recent sinus congestive symptoms and what sounds like bilateral serous otitis. She is not have evidence for significant serous effusion at this time. We've recommended that she take over-the-counter Zyrtec and Flonase. She was recently treated with prednisone. Consider ENT referral if not improving by next week

## 2014-10-12 ENCOUNTER — Encounter: Payer: Self-pay | Admitting: Family Medicine

## 2014-10-12 ENCOUNTER — Ambulatory Visit (INDEPENDENT_AMBULATORY_CARE_PROVIDER_SITE_OTHER): Payer: BLUE CROSS/BLUE SHIELD | Admitting: Family Medicine

## 2014-10-12 VITALS — BP 120/98 | Temp 97.9°F | Wt 157.0 lb

## 2014-10-12 DIAGNOSIS — J309 Allergic rhinitis, unspecified: Secondary | ICD-10-CM | POA: Insufficient documentation

## 2014-10-12 DIAGNOSIS — I10 Essential (primary) hypertension: Secondary | ICD-10-CM

## 2014-10-12 DIAGNOSIS — J301 Allergic rhinitis due to pollen: Secondary | ICD-10-CM

## 2014-10-12 MED ORDER — MONTELUKAST SODIUM 10 MG PO TABS: 10.0000 mg | ORAL_TABLET | Freq: Every day | ORAL | Status: DC

## 2014-10-12 NOTE — Patient Instructions (Signed)
Zyrtec plain.......Marland Kitchen. or Allegra plain........Marland Kitchen. 1 at bedtime  Afrin nasal spray......Marland Kitchen. 1 shot up each nostril at bedtime........ 5 night limit  Then use the steroid nasal spray......Marland Kitchen. 1 shot up each nostril at bedtime  Singulair 10 mg.......... one daily at bedtime  Check your blood pressure daily in the morning  Return in 2 weeks for follow-up........Marland Kitchen. bring a record of all your blood pressure readings and the device with you when you return  Continue the hydrochlorothiazide one tablet daily in the morning
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
## 2014-10-12 NOTE — Progress Notes (Signed)
Pre visit review using our clinic review tool, if applicable. No additional management support is needed unless otherwise documented below in the visit note. 

## 2014-10-13 ENCOUNTER — Telehealth: Payer: Self-pay | Admitting: Family Medicine

## 2014-10-13 ENCOUNTER — Ambulatory Visit: Payer: BLUE CROSS/BLUE SHIELD | Admitting: Family Medicine

## 2014-10-13 NOTE — Telephone Encounter (Signed)
emmi emailed °

## 2014-10-17 ENCOUNTER — Telehealth: Payer: Self-pay | Admitting: Family Medicine

## 2014-10-17 ENCOUNTER — Encounter: Payer: Self-pay | Admitting: Family Medicine

## 2014-10-17 ENCOUNTER — Ambulatory Visit (INDEPENDENT_AMBULATORY_CARE_PROVIDER_SITE_OTHER): Payer: BLUE CROSS/BLUE SHIELD | Admitting: Family Medicine

## 2014-10-17 VITALS — BP 140/100 | Temp 97.9°F | Wt 158.0 lb

## 2014-10-17 DIAGNOSIS — H8113 Benign paroxysmal vertigo, bilateral: Secondary | ICD-10-CM

## 2014-10-17 DIAGNOSIS — I10 Essential (primary) hypertension: Secondary | ICD-10-CM

## 2014-10-17 LAB — BASIC METABOLIC PANEL
BUN: 6 mg/dL (ref 6–23)
CO2: 27 mEq/L (ref 19–32)
CREATININE: 0.82 mg/dL (ref 0.40–1.20)
Calcium: 9.5 mg/dL (ref 8.4–10.5)
Chloride: 101 mEq/L (ref 96–112)
GFR: 87.21 mL/min (ref >60.00)
Glucose, Bld: 102 mg/dL — ABNORMAL HIGH (ref 70–99)
Potassium: 4.1 mEq/L (ref 3.5–5.1)
Sodium: 135 mEq/L (ref 135–145)

## 2014-10-17 MED ORDER — LABETALOL HCL 100 MG PO TABS: 100.0000 mg | ORAL_TABLET | Freq: Once | ORAL | Status: AC

## 2014-10-17 NOTE — Telephone Encounter (Signed)
Pt states her bp is 145/100.  Insist to be seen today. Also potassium is high and pt needs labs asap. pls advise.

## 2014-10-17 NOTE — Telephone Encounter (Signed)
Patient states she needs a post hospital f/u today.  [facility] and wants to be seen today. Please advise.

## 2014-10-17 NOTE — Progress Notes (Signed)
   Subjective:    Patient ID: Melanie Carey, female    DOB: 09/19/1986, 29 y.o.   MRN: [id number]  HPI Melanie AbedLindsay is a 29 year old married female nonsmoker who comes in today company by her husband for follow-up having [place] in [place] nausea vomiting vertigo and hyponatremia  We saw her here last Wednesday because of a 2 to three-week of history of intermittent vertigo. Exam at that time was fine other than the intermittent vertigo. She states on Thursday the vertigo seemed to be worse Thursday night she had dry heaves Friday night the same she subsequently [place] admitted the hospital. Admission diagnosis was hyponatremia serum sodium 1:30, dizziness hypokalemia potassium 3.0 dehydration but her electrolytes were actually normal and hypertension however blood pressure on admission was normal.  She has a history of hypertension and since she delivered her baby has been on a diuretic had a core thiazide 25 mg daily. The diuretic was stopped in the hospital she was given IV fluids and given labetalol 100 mg daily for hypertension. BP today 140/100. Vertigo has gone   Review of Systems Review of systems otherwise negative    Objective:   Physical Exam Well-developed well-nourished female no acute distress vital signs stable she's afebrile BP here today 140/100       Assessment & Plan:  Hypertension.......... labetalol 100 mg daily..... BP check every morning follow-up in 2 weeks  Vertigo.........Marland Kitchen. resolved  Hyponatremia.......Marland Kitchen. recheck labs

## 2014-10-17 NOTE — Telephone Encounter (Signed)
Pt has been scheduled today at 230

## 2014-10-17 NOTE — Progress Notes (Signed)
Pre visit review using our clinic review tool, if applicable. No additional management support is needed unless otherwise documented below in the visit note. 

## 2014-10-17 NOTE — Patient Instructions (Signed)
Labetalol 100 mg....... one tablet daily in the morning  Check a blood pressure daily in the morning  Return in 2 weeks for follow-up  When you return bring a record of all your blood pressure readings and the device  Labs today

## 2014-10-17 NOTE — Telephone Encounter (Signed)
PLEASE NOTE: All timestamps contained within this report are represented as [place] Time. CONFIDENTIALTY NOTICE: This fax transmission is intended only for the addressee. It contains information that is legally privileged, confidential or otherwise protected from use or disclosure. If you are not the intended recipient, you are strictly prohibited from reviewing, disclosing, copying using or disseminating any of this information or taking any action in reliance on or regarding this information. If you have received this fax in error, please notify us immediately by telephone so that we can arrange for its return to us. Phone: [phone], Toll-Free: [phone], Fax: [phone] Page: 1 of 2 Call Id: [id number] [facility] - Client TELEPHONE ADVICE RECORD [facility] Patient Name: Melanie Carey Fiorentino Gender: Female DOB: 05/24/1986 Age: 29 Y 7 M 18 D Return Phone Number: [phone] (Primary) Address: [address] City[place]State/Zip: [place] [address] Client [facility] - Client Client Site  [facility] Night Physician Alonza Smokerodd, Jeff Contact Type Call Call Type Triage / Clinical Relationship To Patient Self Return Phone Number [phone] (Primary) Chief Complaint Blood Pressure High Initial Comment Caller states that she was seen yesterday. Having dizziness and nausea for a week now. On BP medication and said her BP was elevated as well. 160/100 PreDisposition Go to ED Nurse Assessment Nurse: Lovell SheehanJenkins, RN, Clarisse GougeBridget Date/Time (Eastern Time): 10/13/2014 8:07:37 PM Confirm and document reason for call. If symptomatic, describe symptoms. ---Caller states that she was seen yesterday. Having dizziness and nausea for a week now. On BP medication and said her BP was elevated as well. 160/100. Pt was on labetalol and it was good and working and now the pt is taking hctz. When the pt switched  she started feeling bad. Has the patient traveled out of the country within the last 30 days? ---Not Applicable Does the patient require triage? ---Yes Related visit to physician within the last 2 weeks? ---Yes Does the PT have any chronic conditions? (i.e. diabetes, asthma, etc.) ---Yes List chronic conditions. ---HTN, Did the patient indicate they were pregnant? ---No Guidelines Guideline Title Affirmed Question Affirmed Notes Nurse Date/Time (Eastern Time) High Blood Pressure BP # 160/100 Lovell SheehanJenkins, RN, Clarisse GougeBridget 10/13/2014 8:11:40 PM Disp. Time Lamount Cohen(Eastern Time) Disposition Final User 10/13/2014 7:47:51 PM Send To Clinical Follow Up Denice BorsQueue Beeler, Carolyn 10/13/2014 8:15:28 PM See PCP When Office is Open (within 3 days) Yes Lovell SheehanJenkins, RN, Clarisse GougeBridget PLEASE NOTE: All timestamps contained within this report are represented as [place] Standard Time. CONFIDENTIALTY NOTICE: This fax transmission is intended only for the addressee. It contains information that is legally privileged, confidential or otherwise protected from use or disclosure. If you are not the intended recipient, you are strictly prohibited from reviewing, disclosing, copying using or disseminating any of this information or taking any action in reliance on or regarding this information. If you have received this fax in error, please notify us immediately by telephone so that we can arrange for its return to us. Phone: [phone], Toll-Free: [phone], Fax: [phone] Page: 2 of 2 Call Id: [id number] Caller Understands: Yes Disagree/Comply: Comply Care Advice Given Per Guideline SEE PCP WITHIN 3 DAYS: You need to be examined within 2 or 3 days. Call your doctor during regular office hours and make an appointment. (Note: if office will be open tomorrow, tell caller to call then, not in 3 days). HIGH BLOOD PRESSURE: CALL BACK IF: * Weakness or numbness of the face, arm or leg on one side of the body occurs *  Difficulty walking,  difficulty talking, or severe headache occurs * Chest pain or difficulty breathing occurs * Your blood pressure is over 180/110 * You become worse. CARE ADVICE given per High Blood Pressure (Adult) guideline. After Care Instructions Given Call Event Type User Date / Time Description

## 2014-10-19 ENCOUNTER — Telehealth: Payer: Self-pay | Admitting: Family Medicine

## 2014-10-19 NOTE — Telephone Encounter (Signed)
Pt called to say she would like a call back about her lab results  °

## 2014-10-20 NOTE — Telephone Encounter (Signed)
Patient is aware 

## 2014-10-24 ENCOUNTER — Telehealth: Payer: Self-pay | Admitting: Family Medicine

## 2014-10-24 NOTE — Telephone Encounter (Signed)
appt scheduled for tomorrow 10/24/14 per Dr Tawanna Coolerodd

## 2014-10-24 NOTE — Telephone Encounter (Signed)
Pt has no appetitie, no energy, does not feel well and would like appt today.  Pt has appt next Monday, but does not feel she can wait until then.  Pt states she will see someone else if needs.

## 2014-10-25 ENCOUNTER — Other Ambulatory Visit (HOSPITAL_COMMUNITY): Payer: Self-pay | Admitting: Obstetrics and Gynecology

## 2014-10-25 ENCOUNTER — Ambulatory Visit (INDEPENDENT_AMBULATORY_CARE_PROVIDER_SITE_OTHER): Payer: BLUE CROSS/BLUE SHIELD | Admitting: Family Medicine

## 2014-10-25 ENCOUNTER — Other Ambulatory Visit: Payer: Self-pay | Admitting: Gastroenterology

## 2014-10-25 ENCOUNTER — Telehealth: Payer: Self-pay | Admitting: Internal Medicine

## 2014-10-25 ENCOUNTER — Encounter: Payer: Self-pay | Admitting: Family Medicine

## 2014-10-25 VITALS — BP 124/88 | Temp 98.3°F | Wt 152.0 lb

## 2014-10-25 DIAGNOSIS — R1084 Generalized abdominal pain: Secondary | ICD-10-CM

## 2014-10-25 DIAGNOSIS — R1031 Right lower quadrant pain: Secondary | ICD-10-CM

## 2014-10-25 DIAGNOSIS — R634 Abnormal weight loss: Secondary | ICD-10-CM

## 2014-10-25 DIAGNOSIS — R1011 Right upper quadrant pain: Secondary | ICD-10-CM

## 2014-10-25 DIAGNOSIS — G8929 Other chronic pain: Secondary | ICD-10-CM

## 2014-10-25 DIAGNOSIS — R110 Nausea: Secondary | ICD-10-CM

## 2014-10-25 DIAGNOSIS — I10 Essential (primary) hypertension: Secondary | ICD-10-CM

## 2014-10-25 LAB — CBC WITH DIFFERENTIAL/PLATELET
BASOS PCT: 0.2 % (ref 0.0–3.0)
Basophils Absolute: 0 10*3/uL (ref 0.0–0.1)
EOS PCT: 0.3 % (ref 0.0–5.0)
Eosinophils Absolute: 0 10*3/uL (ref 0.0–0.7)
HCT: 38.1 % (ref 36.0–46.0)
Hemoglobin: 13.1 g/dL (ref 12.0–15.0)
LYMPHS PCT: 18.4 % (ref 12.0–46.0)
Lymphs Abs: 1.2 10*3/uL (ref 0.7–4.0)
MCHC: 34.4 g/dL (ref 30.0–36.0)
MCV: 78.6 fl (ref 78.0–100.0)
Monocytes Absolute: 0.3 10*3/uL (ref 0.1–1.0)
Monocytes Relative: 4.8 % (ref 3.0–12.0)
NEUTROS ABS: 5.1 10*3/uL (ref 1.4–7.7)
Neutrophils Relative %: 76.3 % (ref 43.0–77.0)
Platelets: 423 10*3/uL — ABNORMAL HIGH (ref 150.0–400.0)
RBC: 4.84 Mil/uL (ref 3.87–5.11)
RDW: 14.9 % (ref 11.5–15.5)
WBC: 6.7 10*3/uL (ref 4.0–10.5)

## 2014-10-25 LAB — POCT URINALYSIS DIPSTICK
Bilirubin, UA: NEGATIVE
Glucose, UA: NEGATIVE
Ketones, UA: NEGATIVE
Leukocytes, UA: NEGATIVE
Nitrite, UA: NEGATIVE
PH UA: 7
Protein, UA: NEGATIVE
Spec Grav, UA: 1.01
Urobilinogen, UA: 0.2

## 2014-10-25 LAB — HEPATIC FUNCTION PANEL
ALK PHOS: 58 U/L (ref 39–117)
ALT: 16 U/L (ref 0–35)
AST: 13 U/L (ref 0–37)
Albumin: 4.5 g/dL (ref 3.5–5.2)
Bilirubin, Direct: 0.1 mg/dL (ref 0.0–0.3)
TOTAL PROTEIN: 7.2 g/dL (ref 6.0–8.3)
Total Bilirubin: 0.5 mg/dL (ref 0.2–1.2)

## 2014-10-25 LAB — LIPASE: Lipase: 26 U/L (ref 11.0–59.0)

## 2014-10-25 LAB — BASIC METABOLIC PANEL
BUN: 7 mg/dL (ref 6–23)
CO2: 29 meq/L (ref 19–32)
CREATININE: 0.81 mg/dL (ref 0.40–1.20)
Calcium: 9.5 mg/dL (ref 8.4–10.5)
Chloride: 98 mEq/L (ref 96–112)
GFR: 88.44 mL/min (ref >60.00)
Glucose, Bld: 108 mg/dL — ABNORMAL HIGH (ref 70–99)
Potassium: 4.6 mEq/L (ref 3.5–5.1)
Sodium: 133 mEq/L — ABNORMAL LOW (ref 135–145)

## 2014-10-25 LAB — AMYLASE: Amylase: 48 U/L (ref 27–131)

## 2014-10-25 LAB — TSH: TSH: 1.07 u[IU]/mL (ref 0.35–4.50)

## 2014-10-25 NOTE — Progress Notes (Signed)
   Subjective:    Patient ID: Landry MellowLindsay C Bradburn, female    DOB: 06/06/1986, 29 y.o.   MRN: [id number]  HPI [name] is a 29 year old female,,,,,, 3 months postpartum,,,,,, who comes in today accompanied by her husband for evaluation of nausea without vomiting, 6 pounds of weight loss, and intermittent right lower quadrant abdominal pain  She has a history of hypertension and is now on Normodyne 100 mg twice a day. Blood pressure normal 124/88  She states for the past week she hasn't felt well. She is fatigued can't take care of her children tired no energy nauseated without vomiting intermittent right lower quadrant abdominal pain which she describes as a sharp pain that is comes and goes on 2 episodes over the last 24 hours. She's lost 6 pounds.  No fever chills earache...Marland Kitchen.Marland Kitchen.. does have tinnitus but that's chronic..... No vomiting no diarrhea says she has 1 loose bowel movement a day. Urinary habits normal normal. A week ago for birth control use condoms.  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 and Cecille AmsterdamBernard Robinson   Review of Systems    review of systems otherwise negative Objective:   Physical Exam  Well-developed well-nourished female no acute distress vital signs stable she's afebrile examination HEENT were negative thyroid normal lungs are clear to auscultation abdominal exam abdomen flat bowel sounds normal liver spleen kidneys not enlarged no palpable masses      Assessment & Plan:  Nausea without vomiting, weight loss, intermittent right lower quadrant abdominal pain etiology unknown,,,,,,,,,, begin workup with basic labs and refer to GI and CT scan of the abdomen

## 2014-10-25 NOTE — Telephone Encounter (Signed)
Spoke to [place] @ Dr Nelida Meuseodd's office. Cancell appointment request. Scheduled pt somewhere else.

## 2014-10-25 NOTE — Patient Instructions (Signed)
Zofran when necessary for nausea  Labs today  We will get you set up for a CT scan of your abdomen ASAP  Also a GI consult to help us determine the cause of your symptoms

## 2014-10-26 ENCOUNTER — Telehealth: Payer: Self-pay | Admitting: Family Medicine

## 2014-10-26 ENCOUNTER — Ambulatory Visit (HOSPITAL_COMMUNITY)
Admission: RE | Admit: 2014-10-26 | Discharge: 2014-10-26 | Disposition: A | Discharge status: Home / Self Care | Payer: BLUE CROSS/BLUE SHIELD | Source: Ambulatory Visit | Attending: Obstetrics and Gynecology | Admitting: Obstetrics and Gynecology

## 2014-10-26 DIAGNOSIS — R110 Nausea: Secondary | ICD-10-CM | POA: Diagnosis present

## 2014-10-26 DIAGNOSIS — R634 Abnormal weight loss: Secondary | ICD-10-CM | POA: Diagnosis not present

## 2014-10-26 DIAGNOSIS — R1031 Right lower quadrant pain: Secondary | ICD-10-CM | POA: Insufficient documentation

## 2014-10-26 DIAGNOSIS — R1011 Right upper quadrant pain: Secondary | ICD-10-CM

## 2014-10-26 NOTE — Telephone Encounter (Signed)
Patient is aware of lab results.

## 2014-10-26 NOTE — Telephone Encounter (Signed)
Pt would like blood work results °

## 2014-10-26 NOTE — Telephone Encounter (Signed)
Left message on machine for patient to return our call 

## 2014-10-27 ENCOUNTER — Ambulatory Visit: Payer: BLUE CROSS/BLUE SHIELD | Admitting: Family Medicine

## 2014-10-28 ENCOUNTER — Ambulatory Visit (HOSPITAL_COMMUNITY): Payer: BLUE CROSS/BLUE SHIELD

## 2014-10-31 ENCOUNTER — Ambulatory Visit: Payer: BLUE CROSS/BLUE SHIELD | Admitting: Family Medicine

## 2014-10-31 ENCOUNTER — Other Ambulatory Visit (INDEPENDENT_AMBULATORY_CARE_PROVIDER_SITE_OTHER): Payer: Self-pay | Admitting: Surgery

## 2014-11-12 NOTE — Pre-Procedure Instructions (Signed)
Melanie MellowLindsay C Carey  11/12/2014   Your procedure is scheduled on:  February 26  [facility]mitting at 05:30 AM.  Call this number if you have problems the morning of surgery: [phone]   Remember:   Do not eat food or drink liquids after midnight.   Take these medicines the morning of surgery with A SIP OF WATER: Labetalol, Tylenol (if needed)   Do not wear jewelry, make-up or nail polish.  Do not wear lotions, powders, or perfumes. You may wear deodorant.  Do not shave 48 hours prior to surgery. Men may shave face and neck.  Do not bring valuables to the hospital.  [facility] is not responsible for any belongings or valuables.               Contacts, dentures or bridgework may not be worn into surgery.  Leave suitcase in the car. After surgery it may be brought to your room.  For patients admitted to the hospital, discharge time is determined by your treatment team.               Patients discharged the day of surgery will not be allowed to drive home.  Name and phone number of your driver: Family/ Friend  Special Instructions: [place] - Preparing for Surgery  Before surgery, you can play an important role.  Because skin is not sterile, your skin needs to be as free of germs as possible.  You can reduce the number of germs on you skin by washing with CHG (chlorahexidine gluconate) soap before surgery.  CHG is an antiseptic cleaner which kills germs and bonds with the skin to continue killing germs even after washing.  Please DO NOT use if you have an allergy to CHG or antibacterial soaps.  If your skin becomes reddened/irritated stop using the CHG and inform your nurse when you arrive at Short Stay.  Do not shave (including legs and underarms) for at least 48 hours prior to the first CHG shower.  You may shave your face.  Please follow these instructions carefully:   1.  Shower with CHG Soap the night before surgery and the morning of Surgery.  2.  If  you choose to wash your hair, wash your hair first as usual with your normal shampoo.  3.  After you shampoo, rinse your hair and body thoroughly to remove the shampoo.  4.  Use CHG as you would any other liquid soap.  You can apply CHG directly to the skin and wash gently with scrungie or a clean washcloth.  5.  Apply the CHG Soap to your body ONLY FROM THE NECK DOWN.  Do not use on open wounds or open sores.  Avoid contact with your eyes, ears, mouth and genitals (private parts).  Wash genitals (private parts) with your normal soap.  6.  Wash thoroughly, paying special attention to the area where your surgery will be performed.  7.  Thoroughly rinse your body with warm water from the neck down.  8.  DO NOT shower/wash with your normal soap after using and rinsing off the CHG Soap.  9.  Pat yourself dry with a clean towel.            10.  Wear clean pajamas.            11.  Place clean sheets on your bed the night of your first shower and do not sleep with pets.  Day of Surgery  Do not apply any lotions the morning of surgery.  Please wear clean clothes to the hospital/surgery center.     Please read over the following fact sheets that you were given: Pain Booklet, Coughing and Deep Breathing and Surgical Site Infection Prevention

## 2014-11-14 ENCOUNTER — Encounter (HOSPITAL_COMMUNITY)
Admission: RE | Admit: 2014-11-14 | Discharge: 2014-11-14 | Disposition: A | Discharge status: Home / Self Care | Payer: BLUE CROSS/BLUE SHIELD | Source: Ambulatory Visit | Attending: Surgery | Admitting: Surgery

## 2014-11-14 ENCOUNTER — Encounter (HOSPITAL_COMMUNITY): Payer: Self-pay

## 2014-11-14 ENCOUNTER — Other Ambulatory Visit: Payer: Self-pay

## 2014-11-14 DIAGNOSIS — Z79899 Other long term (current) drug therapy: Secondary | ICD-10-CM | POA: Diagnosis not present

## 2014-11-14 DIAGNOSIS — I10 Essential (primary) hypertension: Secondary | ICD-10-CM | POA: Diagnosis not present

## 2014-11-14 DIAGNOSIS — K819 Cholecystitis, unspecified: Secondary | ICD-10-CM | POA: Diagnosis present

## 2014-11-14 DIAGNOSIS — K219 Gastro-esophageal reflux disease without esophagitis: Secondary | ICD-10-CM | POA: Diagnosis not present

## 2014-11-14 DIAGNOSIS — K811 Chronic cholecystitis: Secondary | ICD-10-CM | POA: Diagnosis not present

## 2014-11-14 HISTORY — DX: Hypo-osmolality and hyponatremia: E87.1

## 2014-11-14 HISTORY — DX: Gastro-esophageal reflux disease without esophagitis: K21.9

## 2014-11-14 HISTORY — DX: Other specified postprocedural states: Z98.890

## 2014-11-14 HISTORY — DX: Nausea with vomiting, unspecified: R11.2

## 2014-11-14 LAB — BASIC METABOLIC PANEL
Anion gap: 8 (ref 5–15)
BUN: 10 mg/dL (ref 6–23)
CHLORIDE: 106 mmol/L (ref 96–112)
CO2: 26 mmol/L (ref 19–32)
Calcium: 9.7 mg/dL (ref 8.4–10.5)
Creatinine, Ser: 1 mg/dL (ref 0.50–1.10)
GFR calc non Af Amer: 75 mL/min — ABNORMAL LOW (ref >90)
GFR, EST AFRICAN AMERICAN: 87 mL/min — AB (ref >90)
GLUCOSE: 72 mg/dL (ref 70–99)
Potassium: 4.1 mmol/L (ref 3.5–5.1)
SODIUM: 140 mmol/L (ref 135–145)

## 2014-11-14 LAB — CBC
HEMATOCRIT: 36.1 % (ref 36.0–46.0)
HEMOGLOBIN: 12.6 g/dL (ref 12.0–15.0)
MCH: 26.3 pg (ref 26.0–34.0)
MCHC: 34.9 g/dL (ref 30.0–36.0)
MCV: 75.4 fL — AB (ref 78.0–100.0)
Platelets: 262 10*3/uL (ref 150–400)
RBC: 4.79 MIL/uL (ref 3.87–5.11)
RDW: 14.2 % (ref 11.5–15.5)
WBC: 5.2 10*3/uL (ref 4.0–10.5)

## 2014-11-14 LAB — HCG, SERUM, QUALITATIVE: Preg, Serum: NEGATIVE

## 2014-11-14 NOTE — Progress Notes (Signed)
[facility].

## 2014-11-15 NOTE — Progress Notes (Signed)
Anesthesia Chart Review: Patient is a 29 year old female scheduled for laparoscopic cholecystectomy on 11/18/14 by Dr. Barrie Dunker. Blackman.  History includes non-smoker, HTN, GERD, post-operative N/V, wisdom teeth extraction, spontaneous vaginal deliver of a term female infant 07/21/14 ([facility]), hyponatremia (Na 119) with hospitalization in the setting of gastroenteritis symptoms and [facility]14/2014 [facility]. HCTZ was discontinued. Head CT showed no acute abnormalities. PCP is Dr. Tawanna Coolerodd, last visit 10/25/14.  Meds include Tylenol, labetalol.    EKG tracing is pending, but by report in Care Everywhere--10/14/14 EKG showed: NSR, non-specific ST abnormality.  10/26/14 abdominal U/S: IMPRESSION: Mild diffuse gallbladder wall thickening without visible stones. This may reflect chronic cholecystitis. Cannot exclude acalculous cholecystitis. Recommend clinical correlation. If further evaluation is felt warranted clinically, nuclear medicine hepatobiliary scan may be beneficial.  Preoperative labs noted.  Na 140.  Serum pregnancy negative.   Hyponatremia resolved.  Anticipate that she can proceed as planned.  Velna Ochsllison Zelenak, PA-C [facility] Short Stay [facility]/Anesthesiology Phone [phone] 11/15/2014 10:56 AM

## 2014-11-16 ENCOUNTER — Encounter: Payer: Self-pay | Admitting: Family Medicine

## 2014-11-16 ENCOUNTER — Ambulatory Visit (HOSPITAL_BASED_OUTPATIENT_CLINIC_OR_DEPARTMENT_OTHER): Payer: BLUE CROSS/BLUE SHIELD | Admitting: *Deleted

## 2014-11-16 ENCOUNTER — Ambulatory Visit (INDEPENDENT_AMBULATORY_CARE_PROVIDER_SITE_OTHER): Payer: BLUE CROSS/BLUE SHIELD | Admitting: Family Medicine

## 2014-11-16 VITALS — BP 110/80 | Temp 97.9°F | Wt 150.0 lb

## 2014-11-16 DIAGNOSIS — M25561 Pain in right knee: Secondary | ICD-10-CM | POA: Insufficient documentation

## 2014-11-16 DIAGNOSIS — K811 Chronic cholecystitis: Secondary | ICD-10-CM | POA: Diagnosis not present

## 2014-11-16 DIAGNOSIS — R600 Localized edema: Secondary | ICD-10-CM

## 2014-11-16 NOTE — Progress Notes (Signed)
Pre visit review using our clinic review tool, if applicable. No additional management support is needed unless otherwise documented below in the visit note. 

## 2014-11-16 NOTE — Patient Instructions (Signed)
We will set you up a ultrasound at that right lower extremity to evaluate your veins and the popliteal fossa ASAP

## 2014-11-16 NOTE — Progress Notes (Signed)
Unilateral Lower Extremity Venous Duplex Exam - Right - Negative for DVT or Baker's Cyst.

## 2014-11-16 NOTE — Progress Notes (Signed)
   Subjective:    Patient ID: Melanie MellowLindsay C Carey, female    DOB: 10/13/1985, 29 y.o.   MRN: [id number]  HPI Melanie AbedLindsay is a 29 year old female who comes in today for evaluation of right knee pain  We saw her recently with abdominal pain. Workup shows a calculus cholecystitis. She's due to have her gallbladder removed this Friday by Dr. Magnus IvanBlackman. Both her mother and father had gallstones bladder disease  She's had swelling of that right leg intermittently for a long time etiology unknown. A week ago she notices soreness in the posterior portion of her right knee. No history of trauma.   Review of Systems Review of systems negative    Objective:   Physical Exam  Well-developed well-nourished female no acute distress vital signs stable she's afebrile examination the right leg shows 1+ edema left leg no edema. There is some fullness in the popliteal space. Knee is normal. No calf tenderness negative Homans sign      Assessment & Plan:  Chronic swelling right lower extremity now with posterior knee pain question popliteal cyst.......... ultrasound rule out cystic lesion

## 2014-11-17 MED ORDER — CEFAZOLIN SODIUM-DEXTROSE 2-3 GM-% IV SOLR
2.0000 g | INTRAVENOUS | Status: AC
  Administered 2014-11-18: 2 g via INTRAVENOUS
  Filled 2014-11-17: qty 50

## 2014-11-17 NOTE — H&P (Signed)
Melanie Carey 10/31/2014 3:10 PM Location: [facility] Patient #: [id number] DOB: 07/07/1986 Married / Language: English / Race: White Female  History of Present Illness (Melanie Carey A. Melanie IvanBlackman MD; 10/31/2014 3:57 PM) Patient words: intial GB.  The patient is a 29 year old female who presents with abdominal pain. This is a pleasant female referred to me by Melanie Carey for abdominal pain. She is postpartum from October. Starting in January she started having vague abdominal pain. She's had nausea and vomiting on multiple occasions. She has had pain in the right upper quadrant hurting through to the back. This is been very intermittent. She is actually feeling better today. She had an ultrasound showing a thickened gallbladder wall but was otherwise unremarkable. Her liver function tests were normal as well. She also saw Melanie Carey who had scheduled a HIDA scan. She has been having loose bowel movements as well.   Other Problems Melanie Carey(Melanie McDowell, Carey; 0/4/54092/04/2015 8:113:11 PM) Anxiety Disorder Hemorrhoids High blood pressure  Past Surgical History Melanie Carey(Melanie McDowell, Carey; 9/1/47822/04/2015 9:563:11 PM) No pertinent past surgical history  Diagnostic Studies History Melanie Carey(Melanie McDowell, Carey; 2/1/30862/04/2015 5:783:11 PM) Colonoscopy never Mammogram never Pap Smear 1-5 years ago  Allergies Melanie Carey(Melanie McDowell, Carey; 4/6/96292/04/2015 5:283:12 PM) Vicodin *ANALGESICS - OPIOID*  Medication History Melanie Carey(Melanie McDowell, Carey; 4/1/32442/04/2015 0:103:12 PM) Normodyne (100MG  Tablet, Oral) Active.  Social History Melanie Carey(Melanie McDowell, Carey; 2/7/25362/04/2015 6:443:11 PM) Caffeine use Coffee, Tea. Illicit drug use Remotely quit drug use. No alcohol use Tobacco use Never smoker.  Family History Melanie Carey(Melanie McDowell, Carey; 0/3/47422/04/2015 5:953:11 PM) Alcohol Abuse Brother. Anesthetic complications Brother. Breast Cancer Mother. Hypertension Father.  Pregnancy / Birth History Melanie Carey(Melanie McDowell, Carey; 6/3/87562/04/2015 4:333:11 PM) Age at menarche 12 years. Contraceptive History  Oral contraceptives. Gravida 2 Maternal age 30-25 Para 2 Regular periods  Review of Systems Melanie Carey(Melanie Carey; 2/9/51882/04/2015 4:163:11 PM) General Present- Appetite Loss, Fatigue and Weight Loss. Not Present- Chills, Fever, Night Sweats and Weight Gain. Skin Not Present- Change in Wart/Mole, Dryness, Hives, Jaundice, New Lesions, Non-Healing Wounds, Rash and Ulcer. HEENT Present- Ringing in the Ears and Wears glasses/contact lenses. Not Present- Earache, Hearing Loss, Hoarseness, Nose Bleed, Oral Ulcers, Seasonal Allergies, Sinus Pain, Sore Throat, Visual Disturbances and Yellow Eyes. Respiratory Not Present- Bloody sputum, Chronic Cough, Difficulty Breathing, Snoring and Wheezing. Cardiovascular Not Present- Chest Pain, Difficulty Breathing Lying Down, Leg Cramps, Palpitations, Rapid Heart Rate, Shortness of Breath and Swelling of Extremities. Gastrointestinal Present- Abdominal Pain and Change in Bowel Habits. Not Present- Bloating, Bloody Stool, Chronic diarrhea, Constipation, Difficulty Swallowing, Excessive gas, Gets full quickly at meals, Hemorrhoids, Indigestion, Nausea, Rectal Pain and Vomiting. Female Genitourinary Not Present- Frequency, Nocturia, Painful Urination, Pelvic Pain and Urgency. Musculoskeletal Not Present- Back Pain, Joint Pain, Joint Stiffness, Muscle Pain, Muscle Weakness and Swelling of Extremities. Neurological Present- Headaches. Not Present- Decreased Memory, Fainting, Numbness, Seizures, Tingling, Tremor, Trouble walking and Weakness. Psychiatric Not Present- Anxiety, Bipolar, Change in Sleep Pattern, Depression, Fearful and Frequent crying. Endocrine Not Present- Cold Intolerance, Excessive Hunger, Hair Changes, Heat Intolerance, Hot flashes and New Diabetes. Hematology Not Present- Easy Bruising, Excessive bleeding, Gland problems, HIV and Persistent Infections.   Vitals Melanie Carey(Melanie Carey; 6/0/63012/04/2015 6:013:13 PM) 10/31/2014 3:12 PM Weight: 151.5 lb Height: 68in Body  Surface Area: 1.82 m Body Mass Index: 23.04 kg/m Temp.: 98.46F(Temporal)  Pulse: 68 (Regular)  Resp.: 18 (Unlabored)  BP: 132/84 (Sitting, Left Arm, Standard)    Physical Exam (Melanie Carey A. Melanie IvanBlackman MD; 10/31/2014 3:58 PM) General Mental Status-Alert. General Appearance-Consistent with  stated age. Hydration-Well hydrated. Voice-Normal.  Head and Neck Head-normocephalic, atraumatic with no lesions or palpable masses.  Eye Eyeball - Bilateral-Extraocular movements intact. Sclera/Conjunctiva - Bilateral-No scleral icterus.  Chest and Lung Exam Chest and lung exam reveals -quiet, even and easy respiratory effort with no use of accessory muscles and on auscultation, normal breath sounds, no adventitious sounds and normal vocal resonance. Inspection Chest Wall - Normal. Back - normal.  Cardiovascular Cardiovascular examination reveals -on palpation PMI is normal in location and amplitude, no palpable S3 or S4. Normal cardiac borders., normal heart sounds, regular rate and rhythm with no murmurs, carotid auscultation reveals no bruits and normal pedal pulses bilaterally.  Abdomen Inspection Inspection of the abdomen reveals - No Hernias. Skin - Scar - no surgical scars. Palpation/Percussion Palpation and Percussion of the abdomen reveal - Soft, No Rebound tenderness, No Rigidity (guarding) and No hepatosplenomegaly. Tenderness - Right Upper Quadrant. Note: There is mild tenderness with some guarding in the right upper quadrant. Auscultation Auscultation of the abdomen reveals - Bowel sounds normal.  Neurologic Neurologic evaluation reveals -alert and oriented x 3 with no impairment of recent or remote memory. Mental Status-Normal.  Musculoskeletal Normal Exam - Left-Upper Extremity Strength Normal and Lower Extremity Strength Normal. Normal Exam - Right-Upper Extremity Strength Normal, Lower Extremity Weakness.    Assessment & Plan (Melanie Koudelka A.  Melanie Ivan MD; 10/31/2014 3:59 PM) CHRONIC CHOLECYSTITIS (575.11  K81.1) Impression: Based on her symptoms and ultrasound, I do suspect she has chronic cholecystitis. I did discuss whether or not to do the HIDA scan with her. Even if the scan were unremarkable, I believe she warrants cholecystectomy based on her symptoms and her physical examination. I discussed this with her and her husband in detail. I discussed the risk of surgery which includes but is not limited to bleeding, infection, injury to surrounding structures, bile leak, bile duct injury, the need to convert to an open procedure, the chance this may not resolve her symptoms, postoperative recovery, etc. She understands and wished to proceed with surgery     Signed by Shelly Rubenstein, MD (10/31/2014 3:59 PM)

## 2014-11-18 ENCOUNTER — Ambulatory Visit (HOSPITAL_COMMUNITY): Payer: BLUE CROSS/BLUE SHIELD | Admitting: Certified Registered Nurse Anesthetist

## 2014-11-18 ENCOUNTER — Encounter (HOSPITAL_COMMUNITY): Payer: Self-pay | Admitting: *Deleted

## 2014-11-18 ENCOUNTER — Encounter (HOSPITAL_COMMUNITY)
Admission: RE | Disposition: A | Discharge status: Home / Self Care | Payer: Self-pay | Source: Ambulatory Visit | Attending: Surgery

## 2014-11-18 ENCOUNTER — Ambulatory Visit (HOSPITAL_COMMUNITY)
Admission: RE | Admit: 2014-11-18 | Discharge: 2014-11-18 | Disposition: A | Discharge status: Home / Self Care | Payer: BLUE CROSS/BLUE SHIELD | Source: Ambulatory Visit | Attending: Surgery | Admitting: Surgery

## 2014-11-18 ENCOUNTER — Ambulatory Visit (HOSPITAL_COMMUNITY): Payer: BLUE CROSS/BLUE SHIELD | Admitting: Vascular Surgery

## 2014-11-18 DIAGNOSIS — I10 Essential (primary) hypertension: Secondary | ICD-10-CM | POA: Insufficient documentation

## 2014-11-18 DIAGNOSIS — Z79899 Other long term (current) drug therapy: Secondary | ICD-10-CM | POA: Insufficient documentation

## 2014-11-18 DIAGNOSIS — K219 Gastro-esophageal reflux disease without esophagitis: Secondary | ICD-10-CM | POA: Insufficient documentation

## 2014-11-18 DIAGNOSIS — K811 Chronic cholecystitis: Secondary | ICD-10-CM | POA: Insufficient documentation

## 2014-11-18 HISTORY — PX: CHOLECYSTECTOMY: SHX55

## 2014-11-18 SURGERY — LAPAROSCOPIC CHOLECYSTECTOMY
Anesthesia: General | Site: Abdomen

## 2014-11-18 MED ORDER — PROPOFOL 10 MG/ML IV BOLUS
INTRAVENOUS | Status: AC
  Filled 2014-11-18: qty 20

## 2014-11-18 MED ORDER — GLYCOPYRROLATE 0.2 MG/ML IJ SOLN
INTRAMUSCULAR | Status: AC
  Filled 2014-11-18: qty 5

## 2014-11-18 MED ORDER — ACETAMINOPHEN 325 MG PO TABS: 650.0000 mg | ORAL_TABLET | ORAL | Status: DC | PRN

## 2014-11-18 MED ORDER — MIDAZOLAM HCL 2 MG/2ML IJ SOLN
INTRAMUSCULAR | Status: AC
  Filled 2014-11-18: qty 2

## 2014-11-18 MED ORDER — LIDOCAINE HCL (CARDIAC) 20 MG/ML IV SOLN
INTRAVENOUS | Status: AC
  Filled 2014-11-18: qty 10

## 2014-11-18 MED ORDER — ACETAMINOPHEN 650 MG RE SUPP: 650.0000 mg | RECTAL | Status: DC | PRN

## 2014-11-18 MED ORDER — ONDANSETRON HCL 4 MG PO TABS: 4.0000 mg | ORAL_TABLET | Freq: Three times a day (TID) | ORAL | Status: DC | PRN

## 2014-11-18 MED ORDER — PROMETHAZINE HCL 25 MG/ML IJ SOLN
6.2500 mg | Freq: Four times a day (QID) | INTRAMUSCULAR | Status: DC | PRN
  Administered 2014-11-18: 6.25 mg via INTRAVENOUS

## 2014-11-18 MED ORDER — OXYCODONE HCL 5 MG PO TABS
5.0000 mg | ORAL_TABLET | ORAL | Status: DC | PRN
  Administered 2014-11-18: 5 mg via ORAL

## 2014-11-18 MED ORDER — ARTIFICIAL TEARS OP OINT
TOPICAL_OINTMENT | OPHTHALMIC | Status: AC
  Filled 2014-11-18: qty 3.5

## 2014-11-18 MED ORDER — GLYCOPYRROLATE 0.2 MG/ML IJ SOLN
INTRAMUSCULAR | Status: DC | PRN
  Administered 2014-11-18: 0.2 mg via INTRAVENOUS
  Administered 2014-11-18: 0.6 mg via INTRAVENOUS

## 2014-11-18 MED ORDER — ONDANSETRON HCL 4 MG/2ML IJ SOLN
INTRAMUSCULAR | Status: AC
  Filled 2014-11-18: qty 2

## 2014-11-18 MED ORDER — FENTANYL CITRATE 0.05 MG/ML IJ SOLN
INTRAMUSCULAR | Status: AC
  Filled 2014-11-18: qty 5

## 2014-11-18 MED ORDER — PROPOFOL 10 MG/ML IV BOLUS
INTRAVENOUS | Status: DC | PRN
Start: 2014-11-18 — End: 2014-11-18
  Administered 2014-11-18: 50 mg via INTRAVENOUS
  Administered 2014-11-18: 150 mg via INTRAVENOUS

## 2014-11-18 MED ORDER — LIDOCAINE HCL (CARDIAC) 20 MG/ML IV SOLN
INTRAVENOUS | Status: DC | PRN
  Administered 2014-11-18: 60 mg via INTRATRACHEAL
  Administered 2014-11-18: 100 mg via INTRAVENOUS

## 2014-11-18 MED ORDER — MORPHINE SULFATE 2 MG/ML IJ SOLN: 1.0000 mg | INTRAMUSCULAR | Status: DC | PRN

## 2014-11-18 MED ORDER — ONDANSETRON HCL 4 MG/2ML IJ SOLN
INTRAMUSCULAR | Status: DC | PRN
  Administered 2014-11-18 (×2): 4 mg via INTRAVENOUS

## 2014-11-18 MED ORDER — ROCURONIUM BROMIDE 100 MG/10ML IV SOLN
INTRAVENOUS | Status: DC | PRN
  Administered 2014-11-18: 20 mg via INTRAVENOUS

## 2014-11-18 MED ORDER — EPHEDRINE SULFATE 50 MG/ML IJ SOLN
INTRAMUSCULAR | Status: DC | PRN
  Administered 2014-11-18 (×2): 10 mg via INTRAVENOUS
  Administered 2014-11-18: 5 mg via INTRAVENOUS
  Administered 2014-11-18: 10 mg via INTRAVENOUS

## 2014-11-18 MED ORDER — HYDROMORPHONE HCL 1 MG/ML IJ SOLN
INTRAMUSCULAR | Status: AC
  Filled 2014-11-18: qty 1

## 2014-11-18 MED ORDER — SODIUM CHLORIDE 0.9 % IV SOLN: 250.0000 mL | INTRAVENOUS | Status: DC | PRN

## 2014-11-18 MED ORDER — KETOROLAC TROMETHAMINE 30 MG/ML IJ SOLN
INTRAMUSCULAR | Status: DC | PRN
Start: 2014-11-18 — End: 2014-11-18
  Administered 2014-11-18: 30 mg via INTRAVENOUS

## 2014-11-18 MED ORDER — BUPIVACAINE-EPINEPHRINE (PF) 0.25% -1:200000 IJ SOLN
INTRAMUSCULAR | Status: AC
Start: 2014-11-18 — End: 2014-11-18
  Filled 2014-11-18: qty 30

## 2014-11-18 MED ORDER — HYDROMORPHONE HCL 1 MG/ML IJ SOLN
0.2500 mg | INTRAMUSCULAR | Status: DC | PRN
  Administered 2014-11-18 (×2): 0.25 mg via INTRAVENOUS

## 2014-11-18 MED ORDER — 0.9 % SODIUM CHLORIDE (POUR BTL) OPTIME
TOPICAL | Status: DC | PRN
  Administered 2014-11-18: 1000 mL

## 2014-11-18 MED ORDER — PHENYLEPHRINE HCL 10 MG/ML IJ SOLN
INTRAMUSCULAR | Status: DC | PRN
  Administered 2014-11-18 (×2): 80 ug via INTRAVENOUS

## 2014-11-18 MED ORDER — OXYCODONE-ACETAMINOPHEN 5-325 MG PO TABS: 1.0000 | ORAL_TABLET | ORAL | Status: DC | PRN

## 2014-11-18 MED ORDER — LACTATED RINGERS IV SOLN
INTRAVENOUS | Status: DC | PRN
  Administered 2014-11-18 (×2): via INTRAVENOUS

## 2014-11-18 MED ORDER — BUPIVACAINE-EPINEPHRINE 0.25% -1:200000 IJ SOLN
INTRAMUSCULAR | Status: DC | PRN
  Administered 2014-11-18: 20 mL

## 2014-11-18 MED ORDER — SODIUM CHLORIDE 0.9 % IJ SOLN: 3.0000 mL | INTRAMUSCULAR | Status: DC | PRN

## 2014-11-18 MED ORDER — FENTANYL CITRATE 0.05 MG/ML IJ SOLN
INTRAMUSCULAR | Status: DC | PRN
  Administered 2014-11-18: 50 ug via INTRAVENOUS
  Administered 2014-11-18: 100 ug via INTRAVENOUS
  Administered 2014-11-18 (×2): 50 ug via INTRAVENOUS

## 2014-11-18 MED ORDER — OXYCODONE HCL 5 MG PO TABS
ORAL_TABLET | ORAL | Status: AC
  Filled 2014-11-18: qty 1

## 2014-11-18 MED ORDER — NEOSTIGMINE METHYLSULFATE 10 MG/10ML IV SOLN
INTRAVENOUS | Status: DC | PRN
  Administered 2014-11-18: 4 mg via INTRAVENOUS

## 2014-11-18 MED ORDER — PROMETHAZINE HCL 25 MG/ML IJ SOLN
INTRAMUSCULAR | Status: AC
  Filled 2014-11-18: qty 1

## 2014-11-18 MED ORDER — SCOPOLAMINE 1 MG/3DAYS TD PT72
MEDICATED_PATCH | TRANSDERMAL | Status: AC
  Filled 2014-11-18: qty 1

## 2014-11-18 MED ORDER — DEXAMETHASONE SODIUM PHOSPHATE 4 MG/ML IJ SOLN
INTRAMUSCULAR | Status: DC | PRN
  Administered 2014-11-18: 4 mg via INTRAVENOUS

## 2014-11-18 MED ORDER — NEOSTIGMINE METHYLSULFATE 10 MG/10ML IV SOLN
INTRAVENOUS | Status: AC
  Filled 2014-11-18: qty 1

## 2014-11-18 MED ORDER — SUCCINYLCHOLINE CHLORIDE 20 MG/ML IJ SOLN
INTRAMUSCULAR | Status: DC | PRN
  Administered 2014-11-18: 100 mg via INTRAVENOUS

## 2014-11-18 MED ORDER — SCOPOLAMINE 1 MG/3DAYS TD PT72
1.0000 | MEDICATED_PATCH | Freq: Once | TRANSDERMAL | Status: DC
  Administered 2014-11-18: 1 via TRANSDERMAL

## 2014-11-18 MED ORDER — SODIUM CHLORIDE 0.9 % IR SOLN
Status: DC | PRN
  Administered 2014-11-18: 1000 mL

## 2014-11-18 MED ORDER — MIDAZOLAM HCL 5 MG/5ML IJ SOLN
INTRAMUSCULAR | Status: DC | PRN
  Administered 2014-11-18: 2 mg via INTRAVENOUS

## 2014-11-18 MED ORDER — SODIUM CHLORIDE 0.9 % IJ SOLN: 3.0000 mL | Freq: Two times a day (BID) | INTRAMUSCULAR | Status: DC

## 2014-11-18 SURGICAL SUPPLY — 40 items
APPLIER CLIP 5 13 M/L LIGAMAX5 (MISCELLANEOUS) ×3
CANISTER SUCTION 2500CC (MISCELLANEOUS) ×3 IMPLANT
CHLORAPREP W/TINT 26ML (MISCELLANEOUS) ×3 IMPLANT
CLIP APPLIE 5 13 M/L LIGAMAX5 (MISCELLANEOUS) ×1 IMPLANT
COVER MAYO STAND STRL (DRAPES) IMPLANT
COVER SURGICAL LIGHT HANDLE (MISCELLANEOUS) ×3 IMPLANT
DECANTER SPIKE VIAL GLASS SM (MISCELLANEOUS) IMPLANT
DRAPE C-ARM 42X72 X-RAY (DRAPES) IMPLANT
DRAPE LAPAROSCOPIC ABDOMINAL (DRAPES) ×3 IMPLANT
ELECT REM PT RETURN 9FT ADLT (ELECTROSURGICAL) ×3
ELECTRODE REM PT RTRN 9FT ADLT (ELECTROSURGICAL) ×1 IMPLANT
GLOVE BIO SURGEON STRL SZ 6.5 (GLOVE) ×2 IMPLANT
GLOVE BIO SURGEON STRL SZ7 (GLOVE) ×3 IMPLANT
GLOVE BIO SURGEONS STRL SZ 6.5 (GLOVE) ×1
GLOVE BIOGEL PI IND STRL 7.0 (GLOVE) ×3 IMPLANT
GLOVE BIOGEL PI INDICATOR 7.0 (GLOVE) ×6
GLOVE SURG SIGNA 7.5 PF LTX (GLOVE) ×3 IMPLANT
GOWN STRL REUS W/ TWL LRG LVL3 (GOWN DISPOSABLE) ×3 IMPLANT
GOWN STRL REUS W/ TWL XL LVL3 (GOWN DISPOSABLE) ×1 IMPLANT
GOWN STRL REUS W/TWL LRG LVL3 (GOWN DISPOSABLE) ×6
GOWN STRL REUS W/TWL XL LVL3 (GOWN DISPOSABLE) ×2
KIT BASIN OR (CUSTOM PROCEDURE TRAY) ×3 IMPLANT
KIT ROOM TURNOVER OR (KITS) ×3 IMPLANT
LIQUID BAND (GAUZE/BANDAGES/DRESSINGS) ×3 IMPLANT
NS IRRIG 1000ML POUR BTL (IV SOLUTION) ×3 IMPLANT
PAD ARMBOARD 7.5X6 YLW CONV (MISCELLANEOUS) ×3 IMPLANT
POUCH SPECIMEN RETRIEVAL 10MM (ENDOMECHANICALS) ×3 IMPLANT
SCISSORS LAP 5X35 DISP (ENDOMECHANICALS) ×3 IMPLANT
SET CHOLANGIOGRAPH 5 50 .035 (SET/KITS/TRAYS/PACK) IMPLANT
SET IRRIG TUBING LAPAROSCOPIC (IRRIGATION / IRRIGATOR) ×3 IMPLANT
SLEEVE ENDOPATH XCEL 5M (ENDOMECHANICALS) ×6 IMPLANT
SPECIMEN JAR SMALL (MISCELLANEOUS) ×3 IMPLANT
SUT MON AB 4-0 PC3 18 (SUTURE) ×3 IMPLANT
TOWEL OR 17X24 6PK STRL BLUE (TOWEL DISPOSABLE) ×3 IMPLANT
TOWEL OR 17X26 10 PK STRL BLUE (TOWEL DISPOSABLE) ×3 IMPLANT
TRAY LAPAROSCOPIC (CUSTOM PROCEDURE TRAY) ×3 IMPLANT
TROCAR XCEL BLUNT TIP 100MML (ENDOMECHANICALS) ×3 IMPLANT
TROCAR XCEL NON-BLD 5MMX100MML (ENDOMECHANICALS) ×3 IMPLANT
TUBING INSUFFLATION (TUBING) ×3 IMPLANT
WATER STERILE IRR 1000ML POUR (IV SOLUTION) IMPLANT

## 2014-11-18 NOTE — Discharge Instructions (Signed)
[facility] ______[facility] °LAPAROSCOPIC SURGERY: POST OP INSTRUCTIONS °Always review your discharge instruction sheet given to you by the facility where your surgery was performed. °IF YOU HAVE DISABILITY OR FAMILY LEAVE FORMS, YOU MUST BRING THEM TO THE OFFICE FOR PROCESSING.   °DO NOT GIVE THEM TO YOUR DOCTOR. ° °1. A prescription for pain medication may be given to you upon discharge.  Take your pain medication as prescribed, if needed.  If narcotic pain medicine is not needed, then you may take acetaminophen (Tylenol) or ibuprofen (Advil) as needed. °2. Take your usually prescribed medications unless otherwise directed. °3. If you need a refill on your pain medication, please contact your pharmacy.  They will contact our office to request authorization. Prescriptions will not be filled after 5pm or on week-ends. °4. You should follow a light diet the first few days after arrival home, such as soup and crackers, etc.  Be sure to include lots of fluids daily. °5. Most patients will experience some swelling and bruising in the area of the incisions.  Ice packs will help.  Swelling and bruising can take several days to resolve.  °6. It is common to experience some constipation if taking pain medication after surgery.  Increasing fluid intake and taking a stool softener (such as Colace) will usually help or prevent this problem from occurring.  A mild laxative (Milk of Magnesia or Miralax) should be taken according to package instructions if there are no bowel movements after 48 hours. °7. Unless discharge instructions indicate otherwise, you may remove your bandages 24-48 hours after surgery, and you may shower at that time.  You may have steri-strips (small skin tapes) in place directly over the incision.  These strips should be left on the skin for 7-10 days.  If your surgeon used skin glue on the incision, you may shower in 24 hours.  The glue will flake off over the next 2-3 weeks.  Any sutures or  staples will be removed at the office during your follow-up visit. °8. ACTIVITIES:  You may resume regular (light) daily activities beginning the next day--such as daily self-care, walking, climbing stairs--gradually increasing activities as tolerated.  You may have sexual intercourse when it is comfortable.  Refrain from any heavy lifting or straining until approved by your doctor. °a. You may drive when you are no longer taking prescription pain medication, you can comfortably wear a seatbelt, and you can safely maneuver your car and apply brakes. °b. RETURN TO WORK:  __________________________________________________________ °9. You should see your doctor in the office for a follow-up appointment approximately 2-3 weeks after your surgery.  Make sure that you call for this appointment within a day or two after you arrive home to insure a convenient appointment time. °10. OTHER INSTRUCTIONS: __________________________________________________________________________________________________________________________ __________________________________________________________________________________________________________________________ °WHEN TO CALL YOUR DOCTOR: °1. Fever over 101.0 °2. Inability to urinate °3. Continued bleeding from incision. °4. Increased pain, redness, or drainage from the incision. °5. Increasing abdominal pain ° °The clinic staff is available to answer your questions during regular business hours.  Please don’t hesitate to call and ask to speak to one of the nurses for clinical concerns.  If you have a medical emergency, go to the nearest emergency room or call 911.  A surgeon from [facility] is always on call at the hospital. °[address], [address], [place], [place]  [address] ? [address] [address], [place],    [address] °[phone] ? [phone] ? FAX [phone] °Web site:   [email or web address] ° °General Anesthesia, Adult, Care After  °Refer to this  sheet in the next few weeks. These instructions provide you with information on caring for yourself after your procedure. Your health care provider may also give you more specific instructions. Your treatment has been planned according to current medical practices, but problems sometimes occur. Call your health care provider if you have any problems or questions after your procedure.  °WHAT TO EXPECT AFTER THE PROCEDURE  °After the procedure, it is typical to experience:  °Sleepiness.  °Nausea and vomiting. °HOME CARE INSTRUCTIONS  °For the first 24 hours after general anesthesia:  °Have a responsible person with you.  °Do not drive a car. If you are alone, do not take public transportation.  °Do not drink alcohol.  °Do not take medicine that has not been prescribed by your health care provider.  °Do not sign important papers or make important decisions.  °You may resume a normal diet and activities as directed by your health care provider.  °Change bandages (dressings) as directed.  °If you have questions or problems that seem related to general anesthesia, call the hospital and ask for the anesthetist or anesthesiologist on call. °SEEK MEDICAL CARE IF:  °You have nausea and vomiting that continue the day after anesthesia.  °You develop a rash. °SEEK IMMEDIATE MEDICAL CARE IF:  °You have difficulty breathing.  °You have chest pain.  °You have any allergic problems. °Document Released: 12/16/2000 Document Revised: 05/12/2013 Document Reviewed: 03/25/2013  °ExitCare® Patient Information ©2014 [employer].  ° ° °

## 2014-11-18 NOTE — Progress Notes (Signed)
Pt arrived from PACU at 1130.  PT states that her pain and nausea are under control.  She is still groggy from meds.  We will continue to monitor closely.  Husband is at bedside

## 2014-11-18 NOTE — Anesthesia Postprocedure Evaluation (Signed)
  Anesthesia Post-op Note  Patient: Melanie Carey  Procedure(s) Performed: Procedure(s): LAPAROSCOPIC CHOLECYSTECTOMY (N/A)  Patient Location: PACU  Anesthesia Type:General  Level of Consciousness: awake and alert   Airway and Oxygen Therapy: Patient Spontanous Breathing  Post-op Pain: moderate  Post-op Assessment: Post-op Vital signs reviewed, Patient's Cardiovascular Status Stable and Respiratory Function Stable  Post-op Vital Signs: Reviewed  Filed Vitals:   11/18/14 0900  BP: 137/85  Pulse: 73  Temp:   Resp: 14    Complications: No apparent anesthesia complications

## 2014-11-18 NOTE — Op Note (Signed)

## 2014-11-18 NOTE — Progress Notes (Signed)
DR. Sampson GoonFITZGERALD PAGED, TOLD OF PATIENTS CONTINUED NAUSEA,  ORDERED PHENERAN 6.25MG 

## 2014-11-18 NOTE — Transfer of Care (Signed)
Immediate Anesthesia Transfer of Care Note  Patient: Melanie Carey  Procedure(s) Performed: Procedure(s): LAPAROSCOPIC CHOLECYSTECTOMY (N/A)  Patient Location: PACU  Anesthesia Type:General  Level of Consciousness: awake, alert  and oriented  Airway & Oxygen Therapy: Patient Spontanous Breathing and Patient connected to nasal cannula oxygen  Post-op Assessment: Report given to RN and Post -op Vital signs reviewed and stable  Post vital signs: Reviewed and stable  Last Vitals:  Filed Vitals:   11/18/14 0824  BP: 140/97  Pulse: 81  Temp: 36.2 C  Resp: 14    Complications: No apparent anesthesia complications

## 2014-11-18 NOTE — Anesthesia Procedure Notes (Signed)
Procedure Name: Intubation Date/Time: 11/18/2014 7:32 AM Performed by: Maryland Pink Pre-anesthesia Checklist: Patient identified, Emergency Drugs available, Suction available, Patient being monitored and Timeout performed Patient Re-evaluated:Patient Re-evaluated prior to inductionOxygen Delivery Method: Circle system utilized Preoxygenation: Pre-oxygenation with 100% oxygen Intubation Type: IV induction Ventilation: Mask ventilation without difficulty Laryngoscope Size: Mac and 3 Grade View: Grade I Tube type: Oral Tube size: 7.0 mm Number of attempts: 1 Airway Equipment and Method: Stylet and LTA kit utilized Placement Confirmation: ETT inserted through vocal cords under direct vision,  positive ETCO2 and breath sounds checked- equal and bilateral Secured at: 20 cm Tube secured with: Tape Dental Injury: Teeth and Oropharynx as per pre-operative assessment

## 2014-11-18 NOTE — Interval H&P Note (Signed)
History and Physical Interval Note:no change in H and P  11/18/2014 6:31 AM  [name]  has presented today for surgery, with the diagnosis of Cholecystitis  The various methods of treatment have been discussed with the patient and family. After consideration of risks, benefits and other options for treatment, the patient has consented to  Procedure(s): LAPAROSCOPIC CHOLECYSTECTOMY (N/A) as a surgical intervention .  The patient's history has been reviewed, patient examined, no change in status, stable for surgery.  I have reviewed the patient's chart and labs.  Questions were answered to the patient's satisfaction.     Vanesha Athens A

## 2014-11-18 NOTE — Anesthesia Preprocedure Evaluation (Addendum)
Anesthesia Evaluation  Patient identified by MRN, date of birth, ID band Patient awake    Reviewed: Allergy & Precautions, H&P , NPO status , Patient's Chart, lab work & pertinent test results, reviewed documented beta blocker date and time   History of Anesthesia Complications (+) PONV  Airway Mallampati: II  TM Distance: >3 FB Neck ROM: Full    Dental no notable dental hx. (+) Teeth Intact, Dental Advisory Given   Pulmonary neg pulmonary ROS,  breath sounds clear to auscultation  Pulmonary exam normal       Cardiovascular hypertension, Pt. on medications and Pt. on home beta blockers Rhythm:Regular Rate:Normal     Neuro/Psych negative neurological ROS  negative psych ROS   GI/Hepatic Neg liver ROS, GERD-  Controlled,  Endo/Other  negative endocrine ROS  Renal/GU negative Renal ROS  negative genitourinary   Musculoskeletal   Abdominal   Peds  Hematology negative hematology ROS (+)   Anesthesia Other Findings   Reproductive/Obstetrics negative OB ROS                            Anesthesia Physical Anesthesia Plan  ASA: II  Anesthesia Plan: General   Post-op Pain Management:    Induction: Intravenous  Airway Management Planned: Oral ETT  Additional Equipment:   Intra-op Plan:   Post-operative Plan: Extubation in OR  Informed Consent: I have reviewed the patients History and Physical, chart, labs and discussed the procedure including the risks, benefits and alternatives for the proposed anesthesia with the patient or authorized representative who has indicated his/her understanding and acceptance.   Dental advisory given  Plan Discussed with: CRNA  Anesthesia Plan Comments:         Anesthesia Quick Evaluation

## 2014-11-21 ENCOUNTER — Encounter: Payer: Self-pay | Admitting: Family Medicine

## 2014-11-21 ENCOUNTER — Encounter (HOSPITAL_COMMUNITY): Payer: Self-pay | Admitting: Surgery

## 2016-07-16 IMAGING — US US ABDOMEN COMPLETE
1 series · 13 of 25 positions shown · non-contrast
Comparison: None.

CLINICAL DATA: Nausea. 6 lb weight loss. Intermittent right lower
quadrant pain.

EXAM:
ULTRASOUND ABDOMEN COMPLETE

[Series 1: us abdomen complete · 0.15mm/px · 13 of 73 slices shown]
[im 1/73]
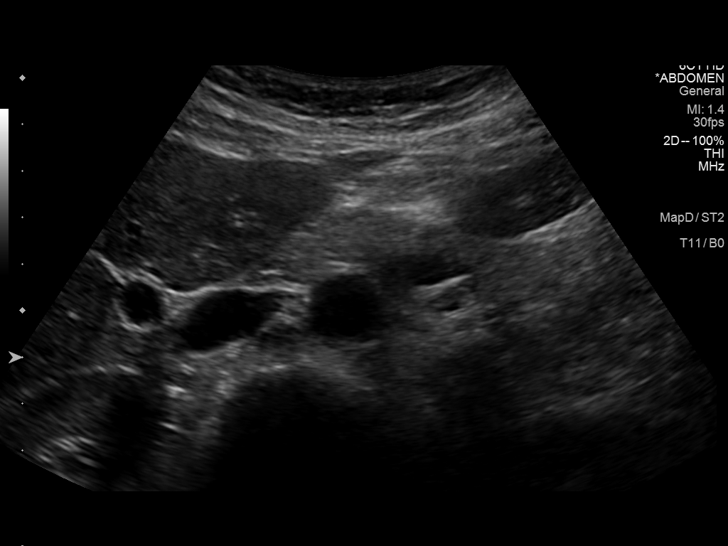
[im 7/73]
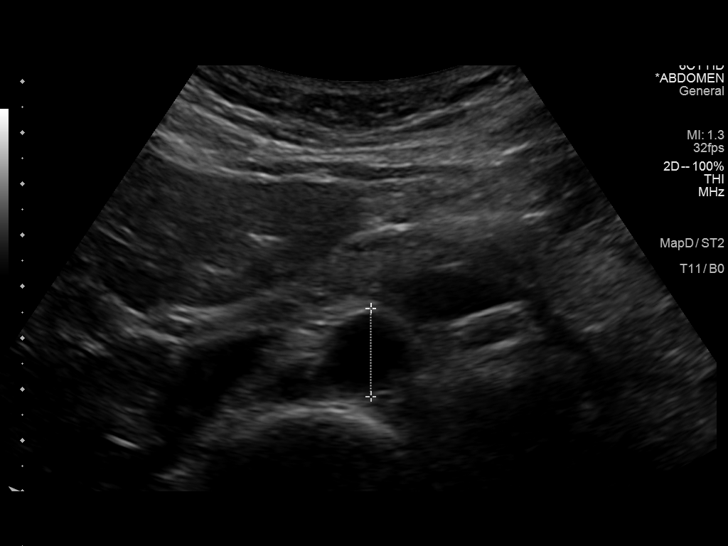
[im 13/73]
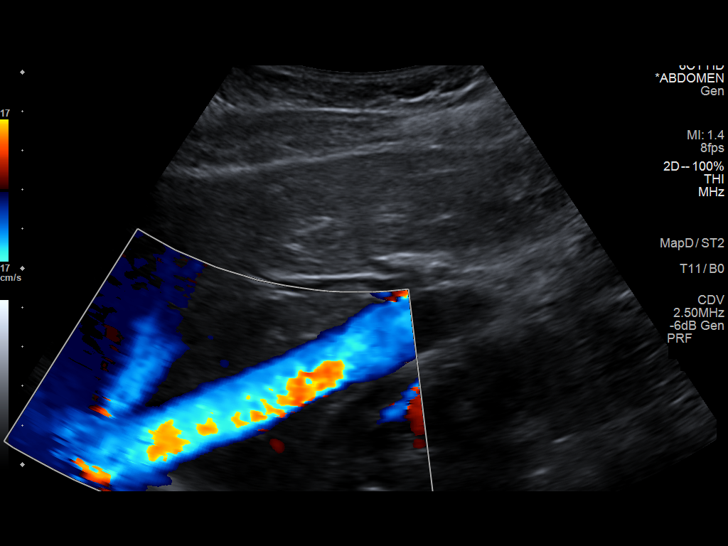
[im 19/73]
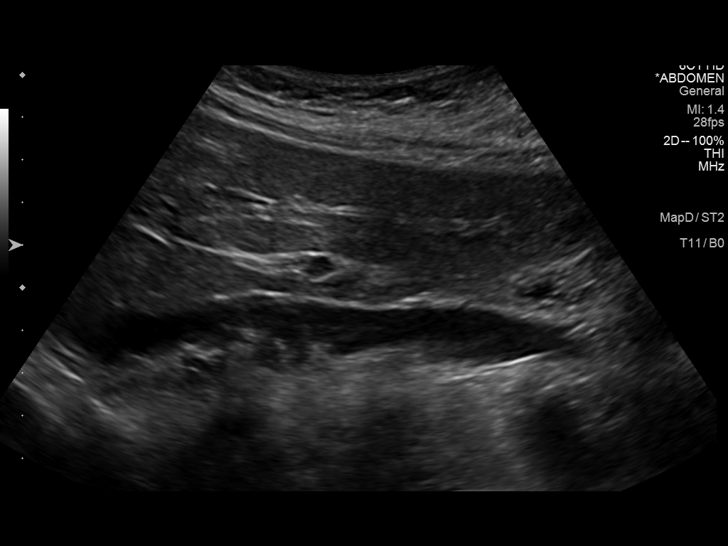
[im 25/73]
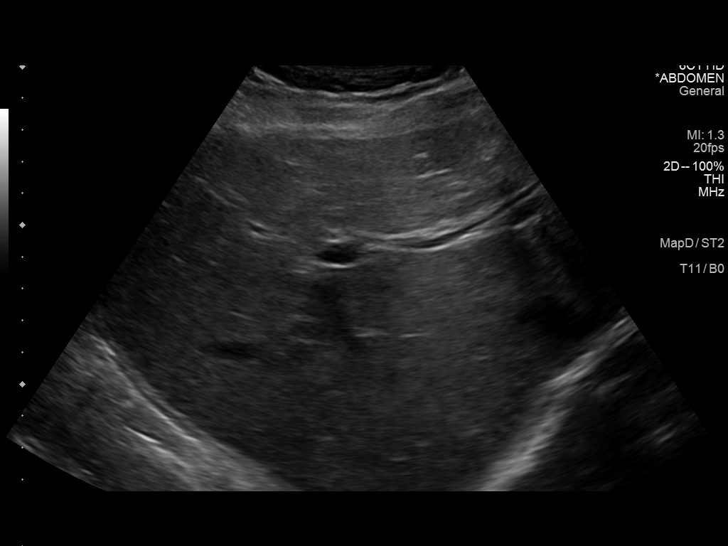
[im 31/73]
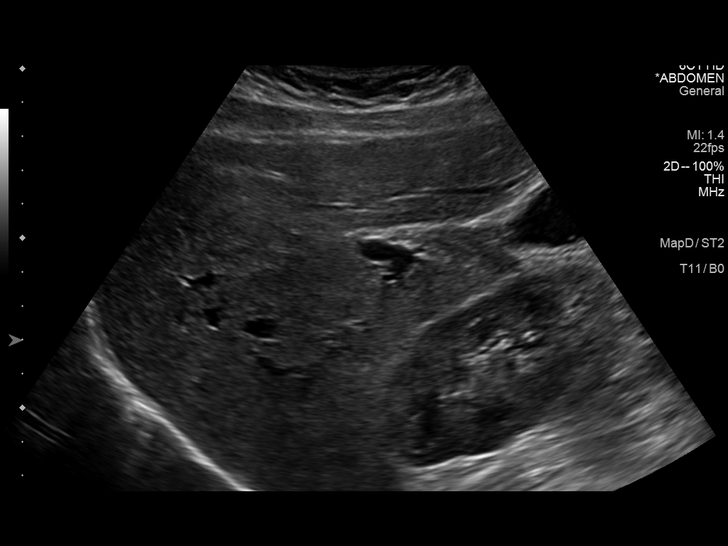
[im 37/73]
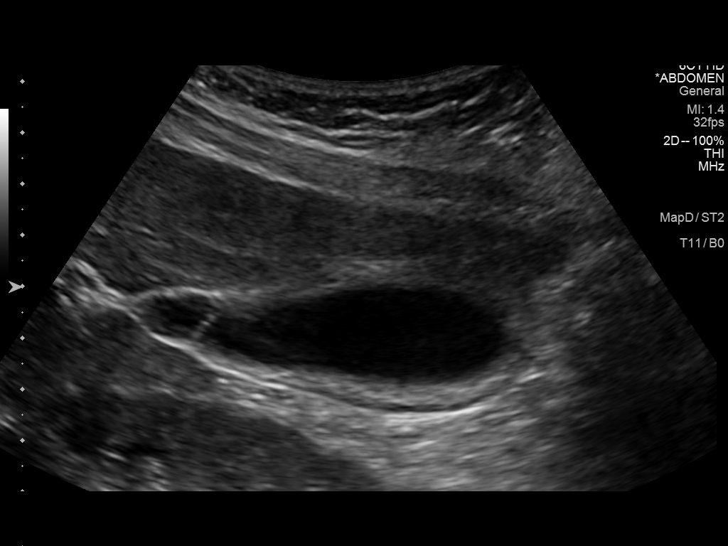
[im 43/73]
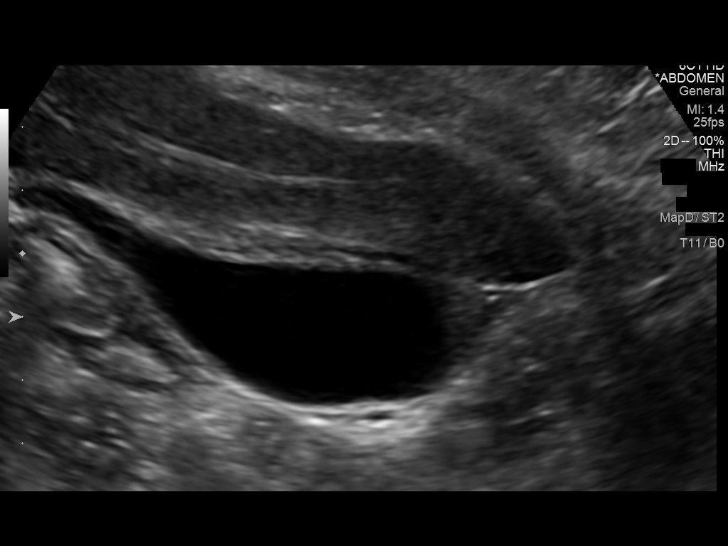
[im 49/73]
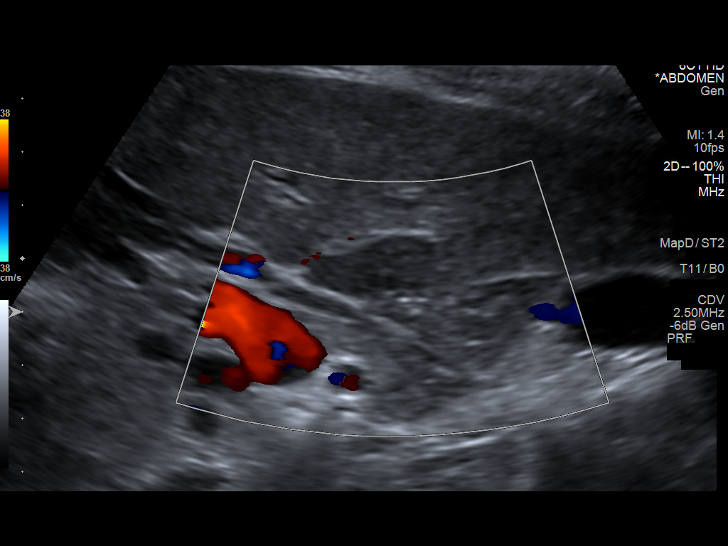
[im 55/73]
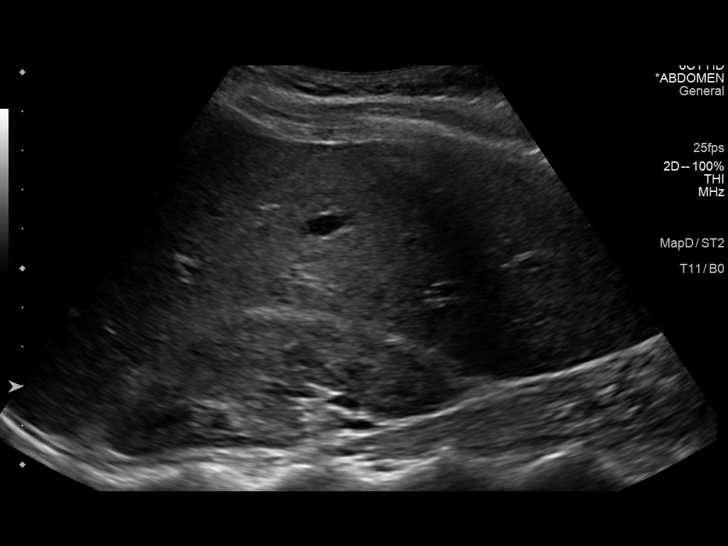
[im 61/73]
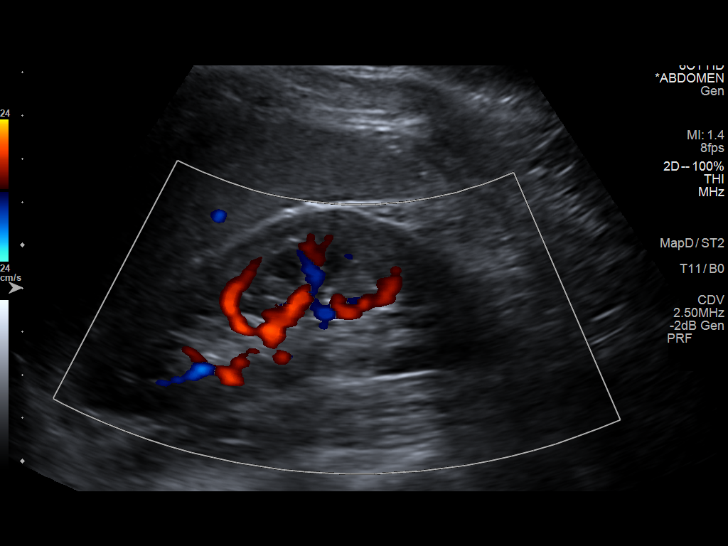
[im 67/73]
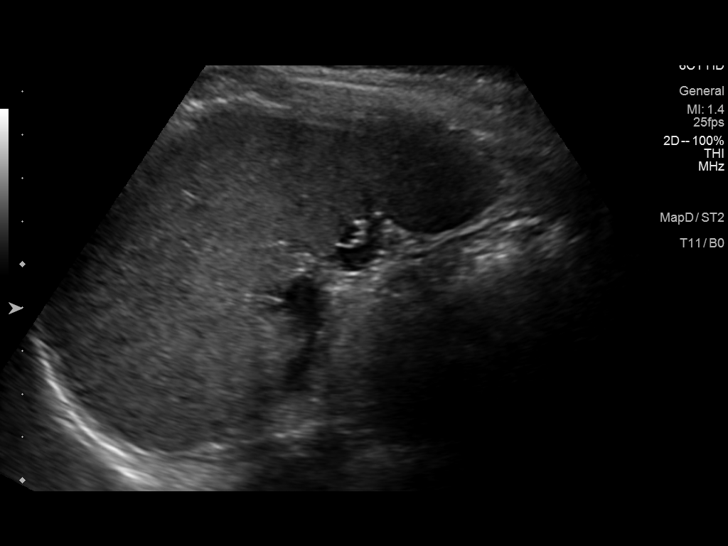
[im 73/73]
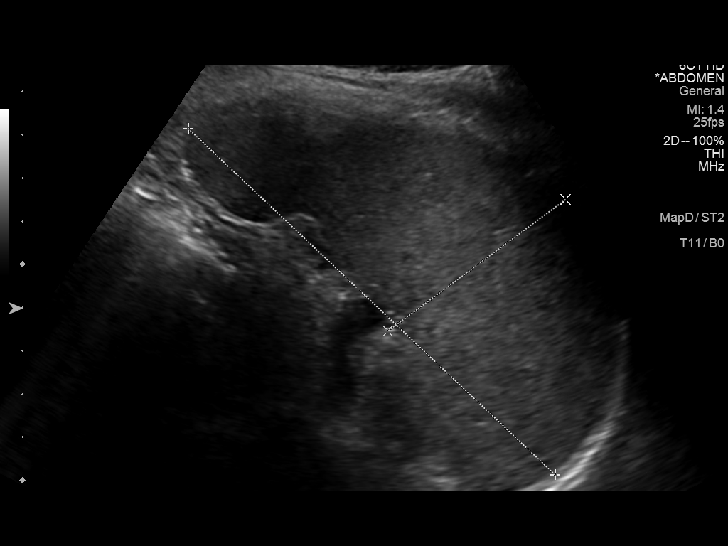

[13 of 25 positions shown; findings below may reference images not displayed]

FINDINGS: Gallbladder: There is mild diffuse wall thickening throughout the
gallbladder measuring 3-5 mm. No visible stones. The patient was
slightly tender over the gallbladder, but no definitive positive
sonographic Tipstop.

Common bile duct: Diameter: Normal caliber, 2 mm.

Liver: No focal lesion identified. Within normal limits in
parenchymal echogenicity.

IVC: No abnormality visualized.

Pancreas: Visualized portion unremarkable.

Spleen: Size and appearance within normal limits.

Right Kidney: Length: 10.8 cm. Echogenicity within normal limits. No
mass or hydronephrosis visualized.

Left Kidney: Length: 11.1 cm. Echogenicity within normal limits. No
mass or hydronephrosis visualized.

Abdominal aorta: No aneurysm visualized.

Other findings: None.
IMPRESSION: Mild diffuse gallbladder wall thickening without visible stones.
This may reflect chronic cholecystitis. Cannot exclude acalculous
cholecystitis. Recommend clinical correlation. If further evaluation
is felt warranted clinically, nuclear medicine hepatobiliary scan
may be beneficial.

## 2016-09-27 ENCOUNTER — Other Ambulatory Visit: Payer: Self-pay | Admitting: Obstetrics and Gynecology

## 2016-09-27 DIAGNOSIS — Z803 Family history of malignant neoplasm of breast: Secondary | ICD-10-CM

## 2016-09-27 DIAGNOSIS — N644 Mastodynia: Secondary | ICD-10-CM

## 2016-10-01 ENCOUNTER — Other Ambulatory Visit: Payer: Self-pay | Admitting: Obstetrics and Gynecology

## 2016-10-01 ENCOUNTER — Ambulatory Visit
Admission: RE | Admit: 2016-10-01 | Discharge: 2016-10-01 | Disposition: A | Discharge status: Home / Self Care | Payer: BLUE CROSS/BLUE SHIELD | Source: Ambulatory Visit | Attending: Obstetrics and Gynecology | Admitting: Obstetrics and Gynecology

## 2016-10-01 DIAGNOSIS — Z803 Family history of malignant neoplasm of breast: Secondary | ICD-10-CM

## 2016-10-01 DIAGNOSIS — N6310 Unspecified lump in the right breast, unspecified quadrant: Secondary | ICD-10-CM

## 2016-10-01 DIAGNOSIS — N644 Mastodynia: Secondary | ICD-10-CM

## 2016-10-31 ENCOUNTER — Other Ambulatory Visit: Payer: Self-pay | Admitting: Obstetrics and Gynecology

## 2016-10-31 DIAGNOSIS — Z803 Family history of malignant neoplasm of breast: Secondary | ICD-10-CM

## 2017-06-13 ENCOUNTER — Encounter: Payer: Self-pay | Admitting: Family Medicine

## 2017-10-09 ENCOUNTER — Other Ambulatory Visit: Payer: Self-pay | Admitting: Obstetrics and Gynecology

## 2017-10-09 DIAGNOSIS — Z1231 Encounter for screening mammogram for malignant neoplasm of breast: Secondary | ICD-10-CM

## 2017-10-29 ENCOUNTER — Ambulatory Visit
Admission: RE | Admit: 2017-10-29 | Discharge: 2017-10-29 | Disposition: A | Discharge status: Home / Self Care | Payer: BLUE CROSS/BLUE SHIELD | Source: Ambulatory Visit | Attending: Obstetrics and Gynecology | Admitting: Obstetrics and Gynecology

## 2017-10-29 DIAGNOSIS — Z1231 Encounter for screening mammogram for malignant neoplasm of breast: Secondary | ICD-10-CM

## 2019-01-18 ENCOUNTER — Other Ambulatory Visit: Payer: Self-pay | Admitting: Obstetrics and Gynecology

## 2019-01-18 DIAGNOSIS — R928 Other abnormal and inconclusive findings on diagnostic imaging of breast: Secondary | ICD-10-CM

## 2019-01-21 ENCOUNTER — Ambulatory Visit
Admission: RE | Admit: 2019-01-21 | Discharge: 2019-01-21 | Disposition: A | Discharge status: Home / Self Care | Payer: BLUE CROSS/BLUE SHIELD | Source: Ambulatory Visit | Attending: Obstetrics and Gynecology | Admitting: Obstetrics and Gynecology

## 2019-01-21 ENCOUNTER — Other Ambulatory Visit: Payer: Self-pay

## 2019-01-21 DIAGNOSIS — R928 Other abnormal and inconclusive findings on diagnostic imaging of breast: Secondary | ICD-10-CM

## 2019-01-26 ENCOUNTER — Other Ambulatory Visit: Payer: BLUE CROSS/BLUE SHIELD

## 2019-10-19 ENCOUNTER — Other Ambulatory Visit: Payer: Self-pay | Admitting: Radiology

## 2019-10-19 DIAGNOSIS — N644 Mastodynia: Secondary | ICD-10-CM

## 2019-10-22 ENCOUNTER — Other Ambulatory Visit: Payer: Self-pay

## 2019-10-22 ENCOUNTER — Ambulatory Visit
Admission: RE | Admit: 2019-10-22 | Discharge: 2019-10-22 | Disposition: A | Discharge status: Home / Self Care | Payer: Managed Care, Other (non HMO) | Source: Ambulatory Visit | Attending: Radiology | Admitting: Radiology

## 2019-10-22 DIAGNOSIS — N644 Mastodynia: Secondary | ICD-10-CM

## 2019-10-27 ENCOUNTER — Other Ambulatory Visit: Payer: Managed Care, Other (non HMO)

## 2020-02-03 ENCOUNTER — Other Ambulatory Visit: Payer: Self-pay | Admitting: Obstetrics and Gynecology

## 2020-02-03 DIAGNOSIS — N6489 Other specified disorders of breast: Secondary | ICD-10-CM

## 2020-02-11 ENCOUNTER — Other Ambulatory Visit: Payer: Self-pay | Admitting: Obstetrics and Gynecology

## 2020-02-11 ENCOUNTER — Ambulatory Visit
Admission: RE | Admit: 2020-02-11 | Discharge: 2020-02-11 | Disposition: A | Discharge status: Home / Self Care | Payer: BC Managed Care – PPO | Source: Ambulatory Visit | Attending: Obstetrics and Gynecology | Admitting: Obstetrics and Gynecology

## 2020-02-11 ENCOUNTER — Other Ambulatory Visit: Payer: Self-pay

## 2020-02-11 DIAGNOSIS — N6489 Other specified disorders of breast: Secondary | ICD-10-CM

## 2020-12-18 ENCOUNTER — Other Ambulatory Visit: Payer: Self-pay | Admitting: Obstetrics and Gynecology

## 2020-12-18 DIAGNOSIS — Z1231 Encounter for screening mammogram for malignant neoplasm of breast: Secondary | ICD-10-CM

## 2021-01-16 ENCOUNTER — Other Ambulatory Visit: Payer: Self-pay | Admitting: Obstetrics and Gynecology

## 2021-01-16 DIAGNOSIS — N644 Mastodynia: Secondary | ICD-10-CM

## 2021-02-12 ENCOUNTER — Ambulatory Visit: Payer: BC Managed Care – PPO

## 2021-02-13 ENCOUNTER — Ambulatory Visit
Admission: RE | Admit: 2021-02-13 | Discharge: 2021-02-13 | Disposition: A | Discharge status: Home / Self Care | Payer: BC Managed Care – PPO | Source: Ambulatory Visit | Attending: Obstetrics and Gynecology | Admitting: Obstetrics and Gynecology

## 2021-02-13 ENCOUNTER — Other Ambulatory Visit: Payer: Self-pay | Admitting: Obstetrics and Gynecology

## 2021-02-13 ENCOUNTER — Other Ambulatory Visit: Payer: Self-pay

## 2021-02-13 DIAGNOSIS — N644 Mastodynia: Secondary | ICD-10-CM

## 2021-02-16 ENCOUNTER — Ambulatory Visit
Admission: RE | Admit: 2021-02-16 | Discharge: 2021-02-16 | Disposition: A | Discharge status: Home / Self Care | Payer: BC Managed Care – PPO | Source: Ambulatory Visit | Attending: Obstetrics and Gynecology | Admitting: Obstetrics and Gynecology

## 2021-02-16 ENCOUNTER — Other Ambulatory Visit: Payer: Self-pay

## 2021-02-16 DIAGNOSIS — N644 Mastodynia: Secondary | ICD-10-CM

## 2021-02-21 ENCOUNTER — Other Ambulatory Visit: Payer: BC Managed Care – PPO

## 2022-03-12 ENCOUNTER — Other Ambulatory Visit: Payer: Self-pay | Admitting: Obstetrics and Gynecology

## 2022-03-12 DIAGNOSIS — Z1231 Encounter for screening mammogram for malignant neoplasm of breast: Secondary | ICD-10-CM

## 2022-08-01 ENCOUNTER — Ambulatory Visit
Admission: RE | Admit: 2022-08-01 | Discharge: 2022-08-01 | Disposition: A | Discharge status: Home / Self Care | Payer: No Typology Code available for payment source | Source: Ambulatory Visit | Attending: Obstetrics and Gynecology | Admitting: Obstetrics and Gynecology

## 2022-08-01 DIAGNOSIS — Z1231 Encounter for screening mammogram for malignant neoplasm of breast: Secondary | ICD-10-CM

## 2022-11-07 IMAGING — MG MM BREAST BX W LOC DEV 1ST LESION IMAGE BX SPEC STEREO GUIDE*L*
8 series · 8 of 12 positions shown · non-contrast
Comparison: Previous exams.
COMPARISON: Previous exams.

Addendum:
CLINICAL DATA: Patient presents for stereotactic core needle biopsy
of an asymmetry, with a possible subtle distortion, in the
posterior, slightly upper left breast.

EXAM:
LEFT BREAST STEREOTACTIC CORE NEEDLE BIOPSY

[L (1 of 6)]
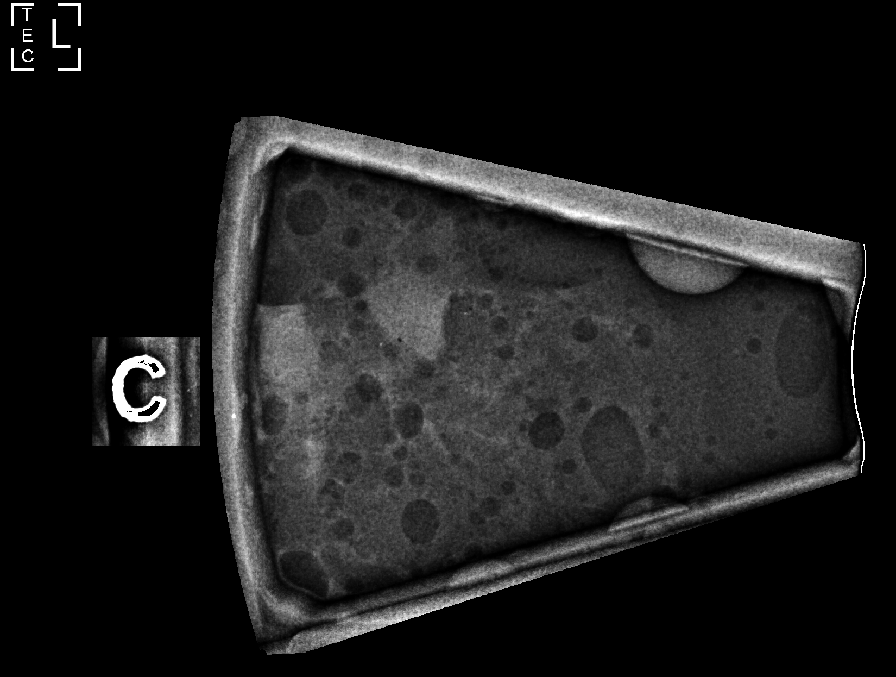

[L (2 of 6)]
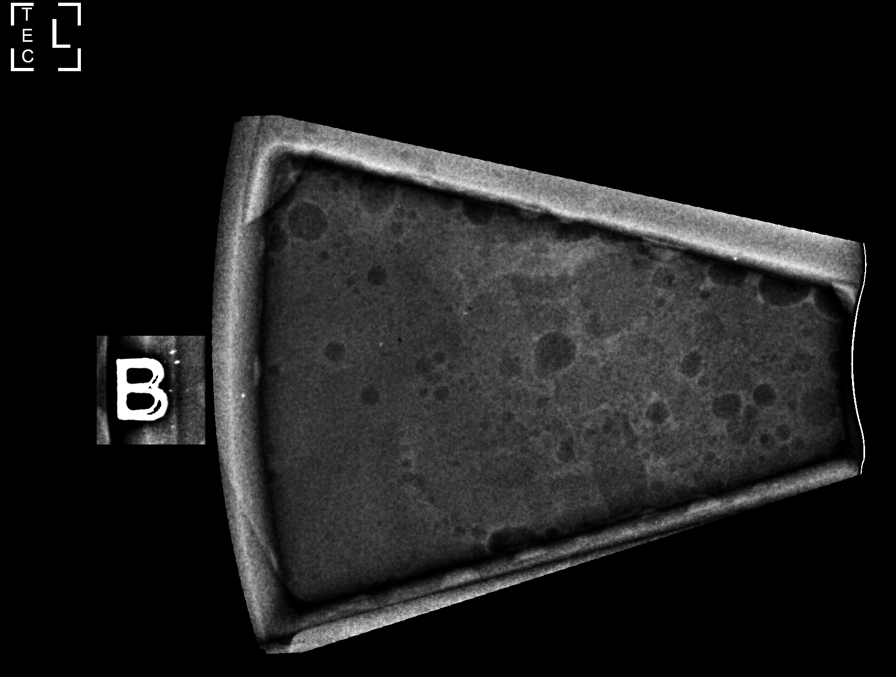

[L (3 of 6)]
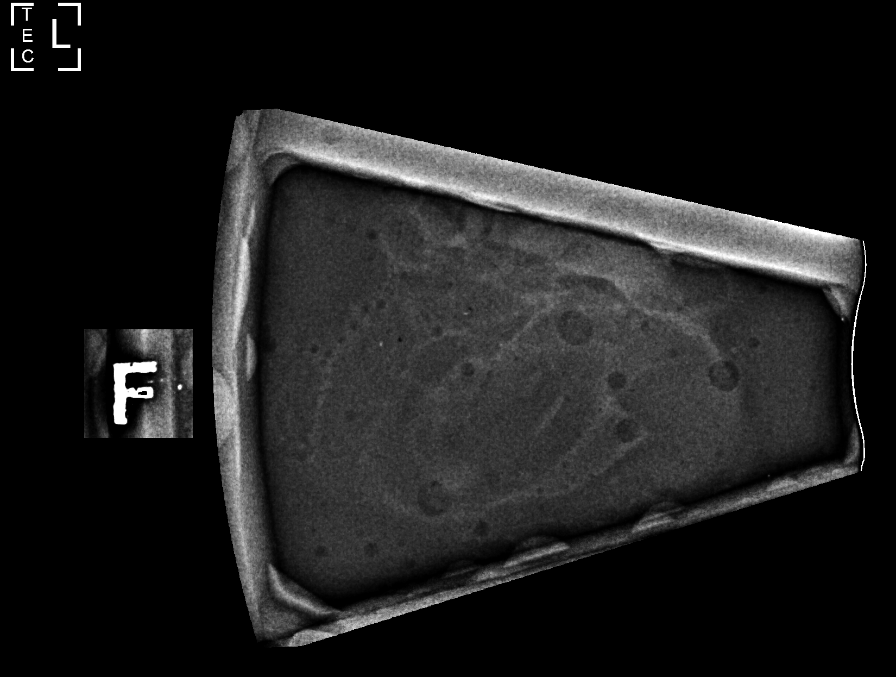

[L (4 of 6)]
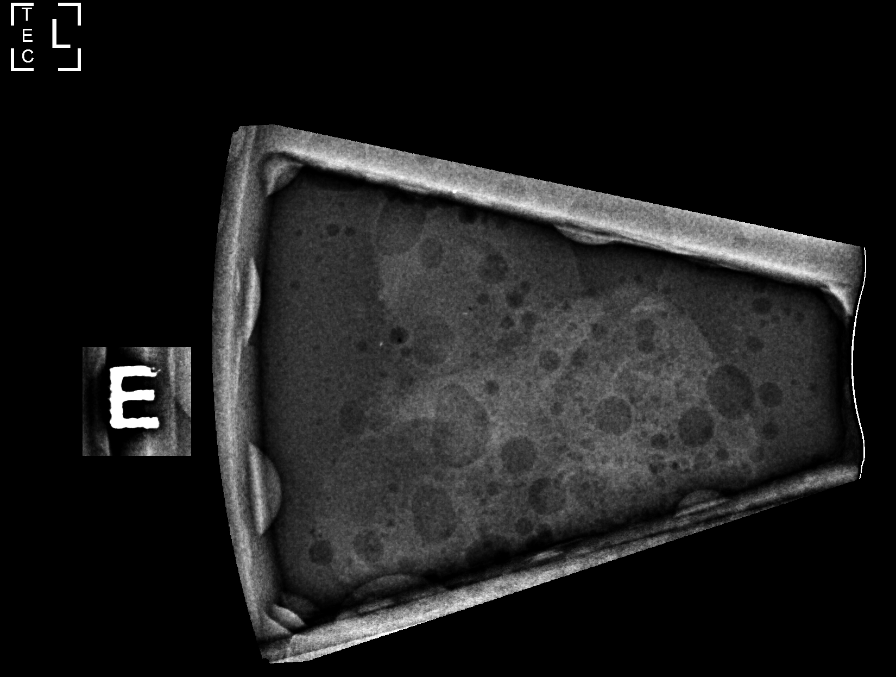

[L (5 of 6)]
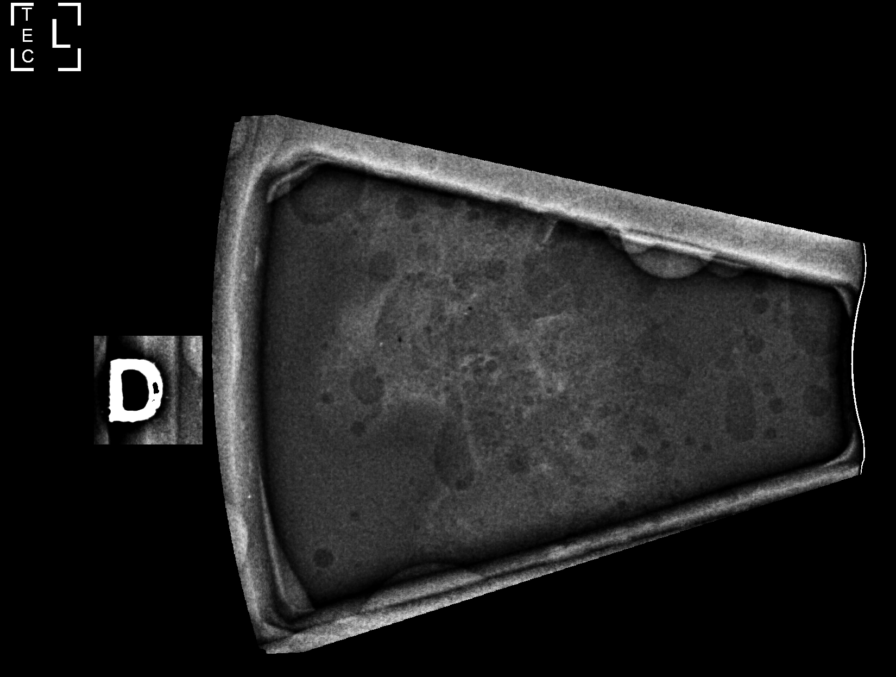

[L (6 of 6)]
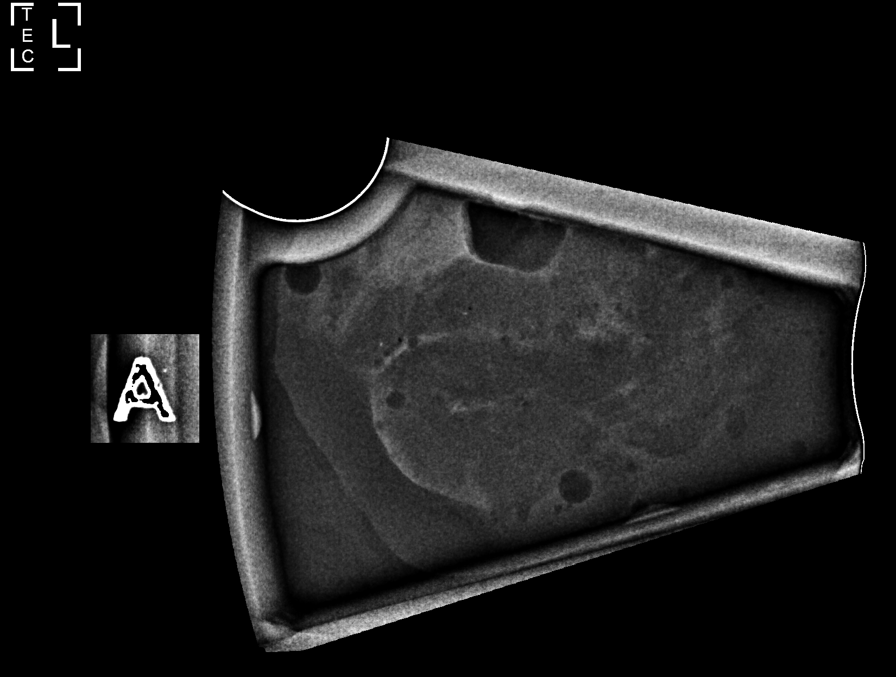

[L LM]
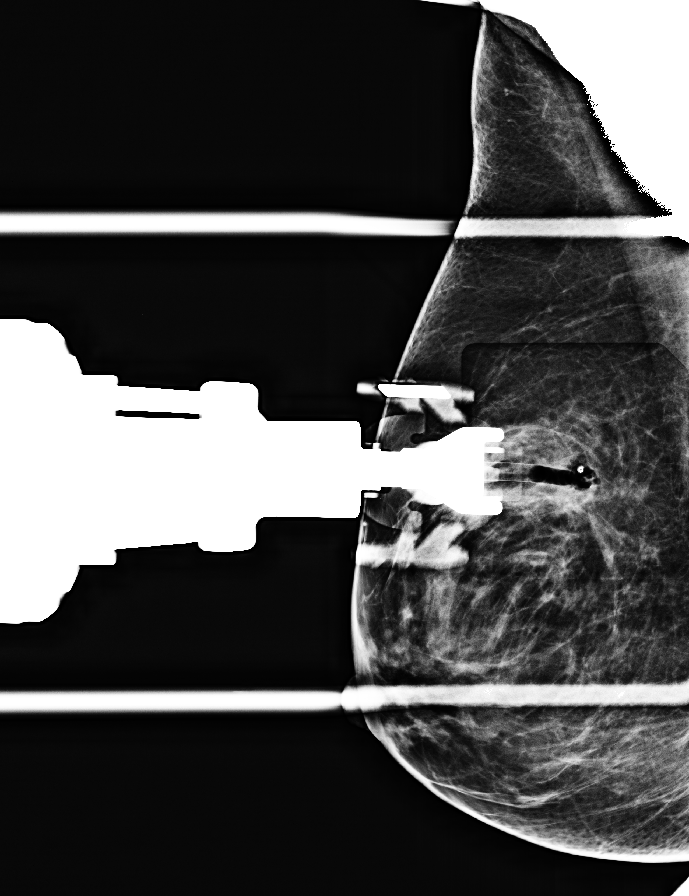

[L LM tomo · tomo slice 31/61.0]
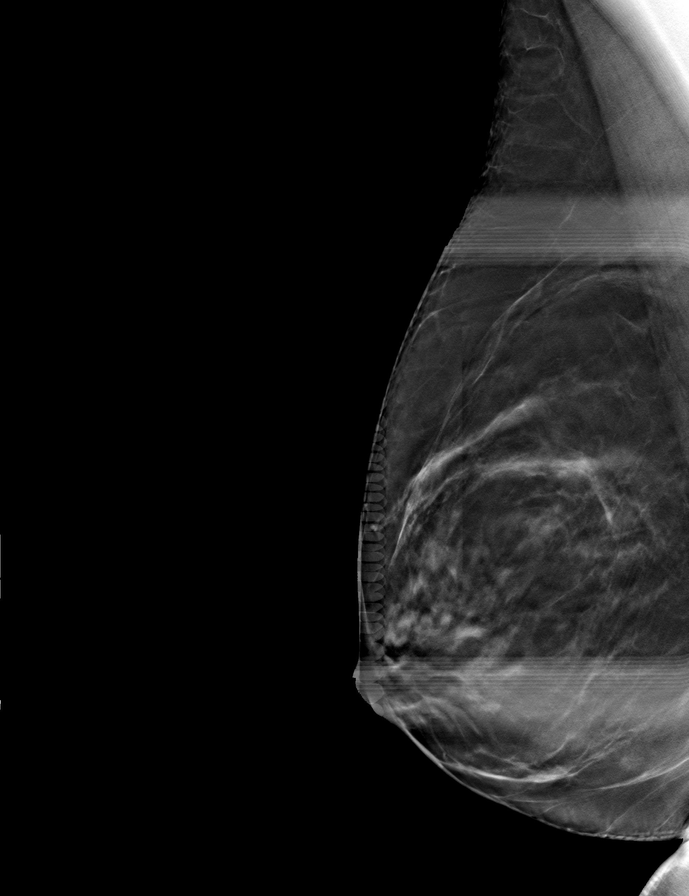

[8 of 12 positions shown; findings below may reference images not displayed]



Using sterile technique and 1% Lidocaine as local anesthetic, under
stereotactic guidance, a 9 gauge vacuum assisted device was used to
perform core needle biopsy of the area asymmetry in the posterior
slightly upper left breast using a lateral approach.

Lesion quadrant: Upper inner quadrant

At the conclusion of the procedure, coil shaped tissue marker clip
was deployed into the biopsy cavity. Follow-up 2-view mammogram was
performed and dictated separately.
IMPRESSION: Stereotactic-guided biopsy of the left breast. No apparent
complications.

ADDENDUM:
Pathology revealed PSEUDOANGIOMATOUS STROMAL HYPERPLASIA (PASH) of
the Left breast, upper, slightly inner quadrant, posterior depth.
This was found to be concordant by Dr. Alvinia Presence.

Pathology results were discussed with the patient by telephone. The
patient reported doing well after the biopsy with tenderness at the
site. Post biopsy instructions and care were reviewed and questions
were answered. The patient was encouraged to call The [REDACTED] for any additional concerns. My direct phone
number was provided.

The patient was instructed to return for annual screening
mammography.

Pathology results reported by Sade Dalessandro, RN on 02/20/2021.



Using sterile technique and 1% Lidocaine as local anesthetic, under
stereotactic guidance, a 9 gauge vacuum assisted device was used to
perform core needle biopsy of the area asymmetry in the posterior
slightly upper left breast using a lateral approach.

Lesion quadrant: Upper inner quadrant

At the conclusion of the procedure, coil shaped tissue marker clip
was deployed into the biopsy cavity. Follow-up 2-view mammogram was
performed and dictated separately.
IMPRESSION: Stereotactic-guided biopsy of the left breast. No apparent
complications.

## 2023-06-25 ENCOUNTER — Ambulatory Visit (INDEPENDENT_AMBULATORY_CARE_PROVIDER_SITE_OTHER): Payer: BC Managed Care – PPO

## 2023-06-25 ENCOUNTER — Ambulatory Visit: Payer: BC Managed Care – PPO | Admitting: Podiatry

## 2023-06-25 DIAGNOSIS — M7989 Other specified soft tissue disorders: Secondary | ICD-10-CM

## 2023-06-25 DIAGNOSIS — M79672 Pain in left foot: Secondary | ICD-10-CM | POA: Diagnosis not present

## 2023-06-25 NOTE — Progress Notes (Signed)
  Subjective:  Patient ID: Melanie Carey, female    DOB: 03-12-1986,   MRN: [id number]  No chief complaint on file.   37 y.o. female presents for concern of a lump on the out side of her left heel. Relates she noticed it about 5 months ago and notes it has been getting bigger. She doesn't relate much pain but is noticeable and concerned for the change.  . Denies any other pedal complaints. Denies n/v/f/c.   Past Medical History:  Diagnosis Date   GERD (gastroesophageal reflux disease)    Hypertension    Low sodium levels    history of   PONV (postoperative nausea and vomiting)     Objective:  Physical Exam: Vascular: DP/PT pulses 2/4 bilateral. CFT <3 seconds. Normal hair growth on digits. No edema.  Skin. No lacerations or abrasions bilateral feet. Soft tissue mass noted to left lateral heel about 2 cm with some mild fluctance to it but mostly firm. No pain to plaption. Non mobile.  Musculoskeletal: MMT 5/5 bilateral lower extremities in DF, PF, Inversion and Eversion. Deceased ROM in DF of ankle joint.  Neurological: Sensation intact to light touch.   Assessment:   1. Left foot pain   2. Soft tissue mass      Plan:  Patient was evaluated and treated and all questions answered. X-rays reviewed and discussed with patient. No osseous changes or fractures noted.  Discussed soft tissue masses and treatment options with the patient. Discussed getting MRI for further evaluation given changes recently want to rule out any malignancy.  Patient to follow-up after MRI.   Louann Sjogren, DPM

## 2023-07-08 ENCOUNTER — Ambulatory Visit
Admission: RE | Admit: 2023-07-08 | Discharge: 2023-07-08 | Disposition: A | Discharge status: Home / Self Care | Payer: BLUE CROSS/BLUE SHIELD | Source: Ambulatory Visit | Attending: Podiatry

## 2023-07-08 DIAGNOSIS — M79672 Pain in left foot: Secondary | ICD-10-CM

## 2023-07-08 DIAGNOSIS — M7989 Other specified soft tissue disorders: Secondary | ICD-10-CM

## 2023-07-15 ENCOUNTER — Encounter: Payer: Self-pay | Admitting: Podiatry

## 2023-07-18 ENCOUNTER — Other Ambulatory Visit: Payer: Self-pay | Admitting: Obstetrics and Gynecology

## 2023-07-18 DIAGNOSIS — Z1231 Encounter for screening mammogram for malignant neoplasm of breast: Secondary | ICD-10-CM

## 2023-08-14 ENCOUNTER — Ambulatory Visit
Admission: RE | Admit: 2023-08-14 | Discharge: 2023-08-14 | Disposition: A | Discharge status: Home / Self Care | Payer: Medicaid Other | Source: Ambulatory Visit | Attending: Obstetrics and Gynecology | Admitting: Obstetrics and Gynecology

## 2023-08-14 DIAGNOSIS — Z1231 Encounter for screening mammogram for malignant neoplasm of breast: Secondary | ICD-10-CM

## 2023-10-22 ENCOUNTER — Encounter (HOSPITAL_BASED_OUTPATIENT_CLINIC_OR_DEPARTMENT_OTHER): Payer: Self-pay | Admitting: Obstetrics and Gynecology

## 2023-10-22 NOTE — Progress Notes (Addendum)
Your procedure is scheduled on  :  Monday,  10-27-2023  Report to [facility] AT  _5:30__ AM.   Call this number if you have problems the morning of surgery:  [phone] Any questions prior to surgery call pre-op nurse,  Tarisa Paola :  [phone]   OUR ADDRESS IS [address].  WE ARE LOCATED IN THE [facility]  PLEASE BRING YOUR INSURANCE CARD AND PHOTO ID DAY OF SURGERY.                                     REMEMBER: Do not eat any food and do drink any liquid after midnight night before surgery.  This includes no water,  candy,  gum,  and  mints.   Please brush your teeth morning of surgery and rinse mouth out.   _____________________________________________________________________     TAKE ONLY THESE MEDICATIONS MORNING OF SURGERY:   May take with sips of water Metoprolol (toprol) Escitalopram (lexapro)                                        DO NOT WEAR JEWERLY/  METAL/  PIERCINGS (INCLUDING NO PLASTIC PIERCINGS) DO NOT WEAR LOTIONS, POWDERS, PERFUMES OR NAIL POLISH ON YOUR FINGERNAILS. TOENAIL POLISH IS OK TO WEAR. DO NOT SHAVE FOR 48 HOURS PRIOR TO DAY OF SURGERY.  CONTACTS, GLASSES, OR DENTURES MAY NOT BE WORN TO SURGERY.  REMEMBER: NO SMOKING, VAPING ,  DRUGS OR ALCOHOL FOR 24 HOURS BEFORE YOUR SURGERY.                                    [facility] IS NOT RESPONSIBLE  FOR ANY BELONGINGS.                                                                    Marland Kitchen           [facility] - Preparing for Surgery Before surgery, you can play an important role.  Because skin is not sterile, your skin needs to be as free of germs as possible.  You can reduce the number of germs on your skin by washing with CHG (chlorahexidine gluconate) soap before surgery.  CHG is an antiseptic cleaner which kills germs and bonds with the skin to continue killing germs even after washing. Please DO NOT use if you have an allergy to CHG or  antibacterial soaps.  If your skin becomes reddened/irritated stop using the CHG and inform your nurse when you arrive at Short Stay. Do not shave (including legs and underarms) for at least 48 hours prior to the first CHG shower.  You may shave your face/neck. Please follow these instructions carefully:  1.  Shower with CHG Soap the night before surgery and the  morning of Surgery.  2.  If you choose to wash your hair, wash your hair first as usual with your  normal  shampoo.  3.  After you shampoo, rinse  your hair and body thoroughly to remove the  shampoo.                                        4.  Use CHG as you would any other liquid soap.  You can apply chg directly  to the skin and wash , chg soap provided, night before and morning of your surgery.  5.  Apply the CHG Soap to your body ONLY FROM THE NECK DOWN.   Do not use on face/ open                           Wound or open sores. Avoid contact with eyes, ears mouth and genitals (private parts).                       Wash face,  Genitals (private parts) with your normal soap.             6.  Wash thoroughly, paying special attention to the area where your surgery  will be performed.  7.  Thoroughly rinse your body with warm water from the neck down.  8.  DO NOT shower/wash with your normal soap after using and rinsing off  the CHG Soap.             9.  Pat yourself dry with a clean towel.            10.  Wear clean pajamas.            11.  Place clean sheets on your bed the night of your first shower and do not  sleep with pets. Day of Surgery : Do not apply any lotions/ powders the morning of surgery.  Please wear clean clothes to the hospital/surgery center.  IF YOU HAVE ANY SKIN IRRITATION OR PROBLEMS WITH THE SURGICAL SOAP, PLEASE GET A BAR OF GOLD DIAL SOAP AND SHOWER THE NIGHT BEFORE YOUR SURGERY AND THE MORNING OF YOUR SURGERY. PLEASE LET THE NURSE KNOW MORNING OF YOUR SURGERY IF YOU HAD ANY PROBLEMS WITH THE SURGICAL  SOAP.   YOUR SURGEON MAY HAVE REQUESTED EXTENDED RECOVERY TIME AFTER YOUR SURGERY. IT COULD BE A  JUST A FEW HOURS  UP TO AN OVERNIGHT STAY.  YOUR SURGEON SHOULD HAVE DISCUSSED THIS WITH YOU PRIOR TO YOUR SURGERY. IN THE EVENT YOU NEED TO STAY OVERNIGHT PLEASE REFER TO THE FOLLOWING GUIDELINES. YOU MAY HAVE UP TO 4 VISITORS  MAY VISIT IN THE EXTENDED RECOVERY ROOM UNTIL 800 PM ONLY.  ONE  VISITOR AGE 25 AND OVER MAY SPEND THE NIGHT AND MUST BE IN EXTENDED RECOVERY ROOM NO LATER THAN 800 PM . YOUR DISCHARGE TIME AFTER YOU SPEND THE NIGHT IS 900 AM THE MORNING AFTER YOUR SURGERY. YOU MAY PACK A SMALL OVERNIGHT BAG WITH TOILETRIES FOR YOUR OVERNIGHT STAY IF YOU WISH.  REGARDLESS OF IF YOU STAY OVER NIGHT OR ARE DISCHARGED THE SAME DAY YOU WILL BE REQUIRED TO HAVE A RESPONSIBLE ADULT (18 YRS OLD OR OLDER) STAY WITH YOU FOR AT LEAST THE FIRST 24 HOURS WHEN HOME  YOUR PRESCRIPTION MEDICATIONS WILL BE PROVIDED DURING [facility] STAY.  ________________________________________________________________________

## 2023-10-22 NOTE — Progress Notes (Signed)
Spoke w/ via phone for pre-op interview--- pt Lab needs dos----   urine preg      Lab results------ lab appt 10-24-2023 @ 1000 getting CBC/ T&S/ BMP/ EKG COVID test -----patient states asymptomatic no test needed Arrive at ------- 0530 on 10-27-2023 NPO after MN w/ exception sips of water w/ meds Med rec completed Medications to take morning of surgery ----- toprol, lexapro Diabetic medication ----- n/a Patient instructed no nail polish to be worn day of surgery Patient instructed to bring photo id and insurance card day of surgery Patient aware to have Driver (ride ) / caregiver    for 24 hours after surgery - husband, Melanie Carey Patient Special Instructions ----- will pick up bag w/ hibiclens and written instructions at lab appt. Asked to call w/ any questions. Pre-Op special Instructions ----- n/a Patient verbalized understanding of instructions that were given at this phone interview. Patient denies chest pain, sob, fever, cough at the interview.

## 2023-10-24 ENCOUNTER — Encounter (HOSPITAL_COMMUNITY)
Admission: RE | Admit: 2023-10-24 | Discharge: 2023-10-24 | Disposition: A | Discharge status: Home / Self Care | Payer: Medicaid Other | Source: Ambulatory Visit | Attending: Obstetrics and Gynecology | Admitting: Obstetrics and Gynecology

## 2023-10-24 DIAGNOSIS — I498 Other specified cardiac arrhythmias: Secondary | ICD-10-CM | POA: Insufficient documentation

## 2023-10-24 DIAGNOSIS — N80129 Deep endometriosis of ovary, unspecified ovary: Secondary | ICD-10-CM | POA: Insufficient documentation

## 2023-10-24 DIAGNOSIS — Z01818 Encounter for other preprocedural examination: Secondary | ICD-10-CM | POA: Insufficient documentation

## 2023-10-24 LAB — BASIC METABOLIC PANEL
Anion gap: 7 (ref 5–15)
BUN: 9 mg/dL (ref 6–20)
CO2: 24 mmol/L (ref 22–32)
Calcium: 9.2 mg/dL (ref 8.9–10.3)
Chloride: 104 mmol/L (ref 98–111)
Creatinine, Ser: 0.9 mg/dL (ref 0.44–1.00)
GFR, Estimated: >60 mL/min (ref >60)
Glucose, Bld: 99 mg/dL (ref 70–99)
Potassium: 4.2 mmol/L (ref 3.5–5.1)
Sodium: 135 mmol/L (ref 135–145)

## 2023-10-24 LAB — CBC
HCT: 38.7 % (ref 36.0–46.0)
Hemoglobin: 13.2 g/dL (ref 12.0–15.0)
MCH: 26.2 pg (ref 26.0–34.0)
MCHC: 34.1 g/dL (ref 30.0–36.0)
MCV: 76.9 fL — ABNORMAL LOW (ref 80.0–100.0)
Platelets: 350 10*3/uL (ref 150–400)
RBC: 5.03 MIL/uL (ref 3.87–5.11)
RDW: 14.7 % (ref 11.5–15.5)
WBC: 7.2 10*3/uL (ref 4.0–10.5)
nRBC: 0 % (ref 0.0–0.2)

## 2023-10-27 ENCOUNTER — Encounter (HOSPITAL_BASED_OUTPATIENT_CLINIC_OR_DEPARTMENT_OTHER): Payer: Self-pay | Admitting: Obstetrics and Gynecology

## 2023-10-27 ENCOUNTER — Ambulatory Visit (HOSPITAL_COMMUNITY): Payer: Medicaid Other

## 2023-10-27 ENCOUNTER — Other Ambulatory Visit: Payer: Self-pay

## 2023-10-27 ENCOUNTER — Ambulatory Visit (HOSPITAL_BASED_OUTPATIENT_CLINIC_OR_DEPARTMENT_OTHER): Payer: Medicaid Other | Admitting: Anesthesiology

## 2023-10-27 ENCOUNTER — Encounter (HOSPITAL_BASED_OUTPATIENT_CLINIC_OR_DEPARTMENT_OTHER)
Admission: RE | Disposition: A | Discharge status: Home / Self Care | Payer: Self-pay | Source: Home / Self Care | Attending: Obstetrics and Gynecology

## 2023-10-27 ENCOUNTER — Ambulatory Visit (HOSPITAL_COMMUNITY)
Admission: RE | Admit: 2023-10-27 | Discharge: 2023-10-28 | Disposition: A | Discharge status: Home / Self Care | Payer: Medicaid Other | Attending: Obstetrics and Gynecology | Admitting: Obstetrics and Gynecology

## 2023-10-27 DIAGNOSIS — N8003 Adenomyosis of the uterus: Secondary | ICD-10-CM | POA: Diagnosis not present

## 2023-10-27 DIAGNOSIS — N80129 Deep endometriosis of ovary, unspecified ovary: Secondary | ICD-10-CM | POA: Diagnosis present

## 2023-10-27 DIAGNOSIS — N80122 Deep endometriosis of left ovary: Secondary | ICD-10-CM | POA: Insufficient documentation

## 2023-10-27 DIAGNOSIS — N80102 Endometriosis of left ovary, unspecified depth: Secondary | ICD-10-CM | POA: Diagnosis not present

## 2023-10-27 DIAGNOSIS — Z01818 Encounter for other preprocedural examination: Secondary | ICD-10-CM

## 2023-10-27 HISTORY — DX: Anxiety disorder, unspecified: F41.9

## 2023-10-27 HISTORY — DX: Vitamin D deficiency, unspecified: E55.9

## 2023-10-27 HISTORY — DX: Depression, unspecified: F32.A

## 2023-10-27 HISTORY — DX: Deficiency of other specified B group vitamins: E53.8

## 2023-10-27 HISTORY — DX: Iron deficiency anemia, unspecified: D50.9

## 2023-10-27 HISTORY — DX: Deep endometriosis of ovary, unspecified ovary: N80.129

## 2023-10-27 HISTORY — PX: HYSTERECTOMY ABDOMINAL WITH SALPINGO-OOPHORECTOMY: SHX6792

## 2023-10-27 LAB — TYPE AND SCREEN
ABO/RH(D): O POS
Antibody Screen: NEGATIVE

## 2023-10-27 LAB — POCT PREGNANCY, URINE: Preg Test, Ur: NEGATIVE

## 2023-10-27 SURGERY — HYSTERECTOMY, ABDOMINAL, WITH SALPINGO-OOPHORECTOMY
Anesthesia: General | Site: Abdomen | Laterality: Bilateral

## 2023-10-27 MED ORDER — PROPOFOL 500 MG/50ML IV EMUL
INTRAVENOUS | Status: DC | PRN
  Administered 2023-10-27: 200 ug/kg/min via INTRAVENOUS

## 2023-10-27 MED ORDER — TRAZODONE HCL 50 MG PO TABS
50.0000 mg | ORAL_TABLET | Freq: Every evening | ORAL | Status: DC | PRN
  Filled 2023-10-27: qty 1

## 2023-10-27 MED ORDER — FENTANYL CITRATE (PF) 100 MCG/2ML IJ SOLN: 50.0000 ug | INTRAMUSCULAR | Status: DC | PRN

## 2023-10-27 MED ORDER — PROPOFOL 10 MG/ML IV BOLUS
INTRAVENOUS | Status: AC
  Filled 2023-10-27: qty 20

## 2023-10-27 MED ORDER — 0.9 % SODIUM CHLORIDE (POUR BTL) OPTIME
TOPICAL | Status: DC | PRN
  Administered 2023-10-27 (×2): 1000 mL

## 2023-10-27 MED ORDER — FENTANYL CITRATE (PF) 100 MCG/2ML IJ SOLN
INTRAMUSCULAR | Status: AC
  Filled 2023-10-27: qty 2

## 2023-10-27 MED ORDER — SODIUM CHLORIDE 0.9 % IV SOLN
2.0000 g | INTRAVENOUS | Status: AC
  Administered 2023-10-27: 2 g via INTRAVENOUS

## 2023-10-27 MED ORDER — EPHEDRINE SULFATE (PRESSORS) 50 MG/ML IJ SOLN
INTRAMUSCULAR | Status: DC | PRN
  Administered 2023-10-27: 5 mg via INTRAVENOUS

## 2023-10-27 MED ORDER — MIDAZOLAM HCL 2 MG/2ML IJ SOLN
INTRAMUSCULAR | Status: DC | PRN
  Administered 2023-10-27: 2 mg via INTRAVENOUS

## 2023-10-27 MED ORDER — LACTATED RINGERS IV SOLN: INTRAVENOUS | Status: DC

## 2023-10-27 MED ORDER — SIMETHICONE 80 MG PO CHEW: 80.0000 mg | CHEWABLE_TABLET | Freq: Four times a day (QID) | ORAL | Status: DC | PRN

## 2023-10-27 MED ORDER — KETOROLAC TROMETHAMINE 30 MG/ML IJ SOLN
INTRAMUSCULAR | Status: AC
  Filled 2023-10-27: qty 1

## 2023-10-27 MED ORDER — ESCITALOPRAM OXALATE 20 MG PO TABS
20.0000 mg | ORAL_TABLET | Freq: Every day | ORAL | Status: DC
Start: 2023-10-28 — End: 2023-10-28
  Filled 2023-10-27: qty 1

## 2023-10-27 MED ORDER — IBUPROFEN 200 MG PO TABS: 600.0000 mg | ORAL_TABLET | Freq: Four times a day (QID) | ORAL | Status: DC

## 2023-10-27 MED ORDER — METOPROLOL SUCCINATE ER 50 MG PO TB24
50.0000 mg | ORAL_TABLET | Freq: Every day | ORAL | Status: DC
Start: 2023-10-28 — End: 2023-10-28
  Filled 2023-10-27: qty 1

## 2023-10-27 MED ORDER — SCOPOLAMINE 1 MG/3DAYS TD PT72
1.0000 | MEDICATED_PATCH | TRANSDERMAL | Status: DC
  Administered 2023-10-27: 1.5 mg via TRANSDERMAL

## 2023-10-27 MED ORDER — LACTATED RINGERS IV SOLN: INTRAVENOUS | Status: AC

## 2023-10-27 MED ORDER — ONDANSETRON HCL 4 MG/2ML IJ SOLN
4.0000 mg | Freq: Four times a day (QID) | INTRAMUSCULAR | Status: DC | PRN
  Administered 2023-10-27 (×2): 4 mg via INTRAVENOUS

## 2023-10-27 MED ORDER — OXYCODONE HCL 5 MG PO TABS: 5.0000 mg | ORAL_TABLET | Freq: Once | ORAL | Status: DC | PRN

## 2023-10-27 MED ORDER — LACTATED RINGERS IV BOLUS
500.0000 mL | Freq: Once | INTRAVENOUS | Status: AC
  Administered 2023-10-27: 500 mL via INTRAVENOUS

## 2023-10-27 MED ORDER — MIDAZOLAM HCL 2 MG/2ML IJ SOLN
INTRAMUSCULAR | Status: AC
  Filled 2023-10-27: qty 2

## 2023-10-27 MED ORDER — LIDOCAINE HCL (CARDIAC) PF 100 MG/5ML IV SOSY
PREFILLED_SYRINGE | INTRAVENOUS | Status: DC | PRN
  Administered 2023-10-27: 50 mg via INTRAVENOUS

## 2023-10-27 MED ORDER — PROPOFOL 10 MG/ML IV BOLUS
INTRAVENOUS | Status: DC | PRN
  Administered 2023-10-27: 150 mg via INTRAVENOUS
  Administered 2023-10-27: 10 mg via INTRAVENOUS

## 2023-10-27 MED ORDER — CELECOXIB 200 MG PO CAPS
ORAL_CAPSULE | ORAL | Status: AC
  Filled 2023-10-27: qty 1

## 2023-10-27 MED ORDER — FENTANYL CITRATE (PF) 100 MCG/2ML IJ SOLN
INTRAMUSCULAR | Status: DC | PRN
  Administered 2023-10-27: 50 ug via INTRAVENOUS
  Administered 2023-10-27 (×2): 25 ug via INTRAVENOUS

## 2023-10-27 MED ORDER — SUGAMMADEX SODIUM 200 MG/2ML IV SOLN
INTRAVENOUS | Status: DC | PRN
  Administered 2023-10-27: 200 mg via INTRAVENOUS

## 2023-10-27 MED ORDER — DEXAMETHASONE SODIUM PHOSPHATE 4 MG/ML IJ SOLN
INTRAMUSCULAR | Status: DC | PRN
  Administered 2023-10-27: 8 mg via INTRAVENOUS

## 2023-10-27 MED ORDER — POVIDONE-IODINE 10 % EX SWAB: 2.0000 | Freq: Once | CUTANEOUS | Status: DC

## 2023-10-27 MED ORDER — KETAMINE HCL 50 MG/5ML IJ SOSY
PREFILLED_SYRINGE | INTRAMUSCULAR | Status: AC
  Filled 2023-10-27: qty 5

## 2023-10-27 MED ORDER — OXYCODONE HCL 5 MG/5ML PO SOLN: 5.0000 mg | Freq: Once | ORAL | Status: DC | PRN

## 2023-10-27 MED ORDER — HYDROMORPHONE HCL 2 MG PO TABS
ORAL_TABLET | ORAL | Status: AC
  Filled 2023-10-27: qty 1

## 2023-10-27 MED ORDER — ONDANSETRON HCL 4 MG PO TABS: 4.0000 mg | ORAL_TABLET | Freq: Four times a day (QID) | ORAL | Status: DC | PRN

## 2023-10-27 MED ORDER — ONDANSETRON HCL 4 MG/2ML IJ SOLN
INTRAMUSCULAR | Status: AC
  Filled 2023-10-27: qty 2

## 2023-10-27 MED ORDER — KETOROLAC TROMETHAMINE 30 MG/ML IJ SOLN
30.0000 mg | Freq: Four times a day (QID) | INTRAMUSCULAR | Status: DC | PRN
  Administered 2023-10-27 – 2023-10-28 (×3): 30 mg via INTRAVENOUS

## 2023-10-27 MED ORDER — SODIUM CHLORIDE 0.9 % IV SOLN
INTRAVENOUS | Status: AC
  Filled 2023-10-27: qty 100

## 2023-10-27 MED ORDER — ACETAMINOPHEN 500 MG PO TABS
1000.0000 mg | ORAL_TABLET | Freq: Once | ORAL | Status: AC
  Administered 2023-10-27: 1000 mg via ORAL

## 2023-10-27 MED ORDER — DEXMEDETOMIDINE HCL IN NACL 80 MCG/20ML IV SOLN
INTRAVENOUS | Status: DC | PRN
  Administered 2023-10-27: 8 ug via INTRAVENOUS
  Administered 2023-10-27: 4 ug via INTRAVENOUS

## 2023-10-27 MED ORDER — KETAMINE HCL 10 MG/ML IJ SOLN
INTRAMUSCULAR | Status: DC | PRN
  Administered 2023-10-27: 15 mg via INTRAVENOUS
  Administered 2023-10-27: 25 mg via INTRAVENOUS
  Administered 2023-10-27: 10 mg via INTRAVENOUS

## 2023-10-27 MED ORDER — ACETAMINOPHEN 325 MG PO TABS
650.0000 mg | ORAL_TABLET | ORAL | Status: DC | PRN
  Administered 2023-10-28 (×2): 650 mg via ORAL

## 2023-10-27 MED ORDER — HYDROMORPHONE HCL 2 MG PO TABS
2.0000 mg | ORAL_TABLET | ORAL | Status: DC | PRN
  Administered 2023-10-27: 2 mg via ORAL

## 2023-10-27 MED ORDER — PROPOFOL 1000 MG/100ML IV EMUL
INTRAVENOUS | Status: AC
  Filled 2023-10-27: qty 200

## 2023-10-27 MED ORDER — MENTHOL 3 MG MT LOZG: 1.0000 | LOZENGE | OROMUCOSAL | Status: DC | PRN

## 2023-10-27 MED ORDER — SCOPOLAMINE 1 MG/3DAYS TD PT72
MEDICATED_PATCH | TRANSDERMAL | Status: AC
  Filled 2023-10-27: qty 1

## 2023-10-27 MED ORDER — BUPIVACAINE HCL (PF) 0.25 % IJ SOLN
INTRAMUSCULAR | Status: DC | PRN
  Administered 2023-10-27: 10 mL

## 2023-10-27 MED ORDER — CEFOXITIN SODIUM 2 G IV SOLR
INTRAVENOUS | Status: AC
  Filled 2023-10-27: qty 2

## 2023-10-27 MED ORDER — AMISULPRIDE (ANTIEMETIC) 5 MG/2ML IV SOLN: 10.0000 mg | Freq: Once | INTRAVENOUS | Status: DC | PRN

## 2023-10-27 MED ORDER — FENTANYL CITRATE (PF) 100 MCG/2ML IJ SOLN
25.0000 ug | INTRAMUSCULAR | Status: DC | PRN
Start: 2023-10-27 — End: 2023-10-28
  Administered 2023-10-27: 25 ug via INTRAVENOUS
  Administered 2023-10-27: 50 ug via INTRAVENOUS
  Administered 2023-10-27: 25 ug via INTRAVENOUS

## 2023-10-27 MED ORDER — EPHEDRINE 5 MG/ML INJ
INTRAVENOUS | Status: AC
  Filled 2023-10-27: qty 5

## 2023-10-27 MED ORDER — ACETAMINOPHEN 500 MG PO TABS
ORAL_TABLET | ORAL | Status: AC
  Filled 2023-10-27: qty 2

## 2023-10-27 MED ORDER — ROCURONIUM BROMIDE 100 MG/10ML IV SOLN
INTRAVENOUS | Status: DC | PRN
  Administered 2023-10-27: 10 mg via INTRAVENOUS
  Administered 2023-10-27: 50 mg via INTRAVENOUS

## 2023-10-27 MED ORDER — CELECOXIB 200 MG PO CAPS
200.0000 mg | ORAL_CAPSULE | Freq: Once | ORAL | Status: AC
  Administered 2023-10-27: 200 mg via ORAL

## 2023-10-27 SURGICAL SUPPLY — 35 items
BENZOIN TINCTURE PRP APPL 2/3 (GAUZE/BANDAGES/DRESSINGS) ×1 IMPLANT
BLADE EXTENDED COATED 6.5IN (ELECTRODE) IMPLANT
CLSR STERI-STRIP ANTIMIC 1/2X4 (GAUZE/BANDAGES/DRESSINGS) ×1 IMPLANT
DRAPE WARM FLUID 44X44 (DRAPES) ×1 IMPLANT
DRSG OPSITE POSTOP 4X10 (GAUZE/BANDAGES/DRESSINGS) ×1 IMPLANT
DURAPREP 26ML APPLICATOR (WOUND CARE) ×1 IMPLANT
GLOVE BIO SURGEON STRL SZ 6.5 (GLOVE) ×1 IMPLANT
GLOVE BIOGEL PI IND STRL 7.0 (GLOVE) ×3 IMPLANT
GOWN STRL REUS W/TWL LRG LVL3 (GOWN DISPOSABLE) ×2 IMPLANT
HEMOSTAT ARISTA ABSORB 3G PWDR (HEMOSTASIS) IMPLANT
HOLDER FOLEY CATH W/STRAP (MISCELLANEOUS) ×1 IMPLANT
KIT TURNOVER CYSTO (KITS) ×1 IMPLANT
MANIFOLD NEPTUNE II (INSTRUMENTS) ×1 IMPLANT
NDL HYPO 22X1.5 SAFETY MO (MISCELLANEOUS) ×1 IMPLANT
NEEDLE HYPO 22X1.5 SAFETY MO (MISCELLANEOUS) ×1
NS IRRIG 500ML POUR BTL (IV SOLUTION) ×1 IMPLANT
PACK ABDOMINAL GYN (CUSTOM PROCEDURE TRAY) ×1 IMPLANT
PAD OB MATERNITY 4.3X12.25 (PERSONAL CARE ITEMS) ×1 IMPLANT
SEPRAFILM MEMBRANE 5X6 (MISCELLANEOUS) IMPLANT
SLEEVE SCD COMPRESS KNEE MED (STOCKING) ×1 IMPLANT
SPIKE FLUID TRANSFER (MISCELLANEOUS) IMPLANT
SPONGE T-LAP 18X18 ~~LOC~~+RFID (SPONGE) ×2 IMPLANT
SPONGE T-LAP 4X18 ~~LOC~~+RFID (SPONGE) IMPLANT
SUT PDS AB 0 CT1 36 (SUTURE) IMPLANT
SUT PDS AB 0 CTX 60 (SUTURE) IMPLANT
SUT PLAIN 2 0 XLH (SUTURE) ×1 IMPLANT
SUT VIC AB 0 CT1 18XCR BRD8 (SUTURE) ×2 IMPLANT
SUT VIC AB 0 CT1 27XBRD ANBCTR (SUTURE) ×4 IMPLANT
SUT VIC AB 2-0 CT1 TAPERPNT 27 (SUTURE) IMPLANT
SUT VIC AB 4-0 KS 27 (SUTURE) ×1 IMPLANT
SUT VICRYL 0 TIES 12 18 (SUTURE) ×1 IMPLANT
SYR BULB IRRIG 60ML STRL (SYRINGE) IMPLANT
SYR CONTROL 10ML LL (SYRINGE) ×1 IMPLANT
TOWEL OR 17X24 6PK STRL BLUE (TOWEL DISPOSABLE) ×2 IMPLANT
TRAY FOLEY W/BAG SLVR 14FR LF (SET/KITS/TRAYS/PACK) ×1 IMPLANT

## 2023-10-27 NOTE — Anesthesia Postprocedure Evaluation (Signed)
Anesthesia Post Note  Patient: Melanie Carey  Procedure(s) Performed: HYSTERECTOMY ABDOMINAL WITH SALPINGO-OOPHORECTOMY (Bilateral: Abdomen)     Patient location during evaluation: PACU Anesthesia Type: General Level of consciousness: awake and alert Pain management: pain level controlled Vital Signs Assessment: post-procedure vital signs reviewed and stable Respiratory status: spontaneous breathing, nonlabored ventilation, respiratory function stable and patient connected to nasal cannula oxygen Cardiovascular status: blood pressure returned to baseline and stable Postop Assessment: no apparent nausea or vomiting Anesthetic complications: no   No notable events documented.  Last Vitals:  Vitals:   10/27/23 1115 10/27/23 1215  BP: 129/84 123/84  Pulse: 61 61  Resp: 17 16  Temp: 36.6 C 36.7 C  SpO2: 96% 96%    Last Pain:  Vitals:   10/27/23 1115  TempSrc:   PainSc: 5                  Shadara Lopez P Angelmarie Ponzo

## 2023-10-27 NOTE — H&P (Signed)
38 year old female with known 6 cm left ovarian endometrioma. Patient having pelvic pain, heavy cycles. Here for definitive treatment with TAH/BSO.  Past Medical History:  Diagnosis Date   Anxiety    B12 deficiency    Depression    Endometrioma    GERD (gastroesophageal reflux disease)    Hypertension    IDA (iron deficiency anemia)    PONV (postoperative nausea and vomiting)    Vitamin D deficiency    Past Surgical History:  Procedure Laterality Date   CHOLECYSTECTOMY N/A 11/18/2014   Procedure: LAPAROSCOPIC CHOLECYSTECTOMY;  Surgeon: Abigail Miyamoto, MD;  Location: [facility] OR;  Service: General;  Laterality: N/A;   WISDOM TOOTH EXTRACTION     Social History   Socioeconomic History   Marital status: Married    Spouse name: Not on file   Number of children: Not on file   Years of education: Not on file   Highest education level: Not on file  Occupational History   Not on file  Tobacco Use   Smoking status: Never   Smokeless tobacco: Never  Vaping Use   Vaping status: Never Used  Substance and Sexual Activity   Alcohol use: No   Drug use: Never   Sexual activity: Yes  Other Topics Concern   Not on file  Social History Narrative   Not on file   Social Drivers of Health   Financial Resource Strain: Not on file  Food Insecurity: Not on file  Transportation Needs: Not on file  Physical Activity: Not on file  Stress: Not on file  Social Connections: Not on file   Family History  Problem Relation Age of Onset   Breast cancer Mother 53   Hypertension Father    Hyperlipidemia Father    Cancer Cousin        unknown primary   Uterine cancer Cousin    Review of Systems  All other systems reviewed and are negative.  Allergies Hydrocodone  BP (!) 129/91   Pulse 63   Temp 97.9 F (36.6 C) (Oral)   Resp 18   Ht 5\' 8"  (1.727 m)   Wt 87.5 kg   LMP 09/26/2023 (Exact Date)   SpO2 98%   BMI 29.35 kg/m  Results for orders placed or performed during the hospital  encounter of 10/27/23 (from the past 24 hours)  Pregnancy, urine POC     Status: None   Collection Time: 10/27/23  6:00 AM  Result Value Ref Range   Preg Test, Ur NEGATIVE NEGATIVE   GENERAL alert and oriented Lung CTAB Car RRR Pelvic 6 cm left adnexal mass  IMPRESSION: Left ovarian endometrioma Pelvic pain  PLAN: TAH/BSO Risks reviewed Consent signed

## 2023-10-27 NOTE — Progress Notes (Signed)
POD # 0  Having some nausea and pain - just given dilaudid and zofran.  BP 123/84 (BP Location: Left Arm)   Pulse 61   Temp 98.1 F (36.7 C)   Resp 16   Ht 5\' 8"  (1.727 m)   Wt 87.5 kg   LMP 09/26/2023 (Exact Date)   SpO2 96%   BMI 29.35 kg/m  Results for orders placed or performed during the hospital encounter of 10/27/23 (from the past 24 hours)  Pregnancy, urine POC     Status: None   Collection Time: 10/27/23  6:00 AM  Result Value Ref Range   Preg Test, Ur NEGATIVE NEGATIVE   Urine output good and clear  Abdomen is soft and non tender  Bandage - a little drainage both corners  Surgery reviewed with patient  Add toradol  Remove foley today  Advance diet when nausea resolved

## 2023-10-27 NOTE — Op Note (Signed)
NAMECASMIRA, CRAMER MEDICAL RECORD NO: [id number] ACCOUNT NO: [id number] DATE OF BIRTH: [birth year] FACILITY: [facility] LOCATION: [facility] PHYSICIAN: Thelbert Gartin L. Vincente Poli, MD  Operative Report   DATE OF PROCEDURE: 10/27/2023  PREOPERATIVE DIAGNOSIS:  Left ovarian endometrioma.  POSTOPERATIVE DIAGNOSIS:  Left ovarian endometrioma.  PROCEDURE:  Total abdominal hysterectomy and bilateral salpingo-oophorectomy.  SURGEON:  Gal Feldhaus L. Vincente Poli, MD  ASSISTANT:  Dr. Rivka Barbara.  ANESTHESIA:  General with local.  ESTIMATED BLOOD LOSS:  200 mL  COMPLICATIONS:  None.  DRAINS:  Foley catheter.  PATHOLOGY:  Uterus, cervix, tubes, and bilateral ovaries.  DESCRIPTION OF PROCEDURE:  The patient was taken to the operating room after informed consent was obtained.  She was prepped and draped in the usual sterile fashion.  A Foley catheter was inserted.  A timeout was performed.  A low transverse incision was  made, carried down to the fascia.  The fascia was scored in the midline and extended laterally.  The peritoneum was entered bluntly.  The peritoneal incision was then stretched.  A self-retaining retractor was placed in the abdominal cavity.  At this  point, we packed the large and small bowel in the upper abdomen.  Inspection of the pelvis revealed that the uterus was normal size.  The right tube and ovary were normal.  There was a large left adnexal mass.  It was smooth walls with some filmy  adhesions posteriorly and laterally.  This was consistent with the preoperative ultrasound.  We proceeded with a hysterectomy in the following fashion.  We suture ligated the round ligament on the right side, incised the anterior and posterior leaves of  the broad ligament, created an avascular window just beneath the infundibulopelvic ligament.  On the right, I placed a curved Heaney clamp across this, suture ligated, and used a free tie for further security.  We then skeletonized the uterine artery.   We  developed the bladder flap with sharp and blunt dissection and placed a curved Heaney clamp at the level of the internal os on the right side.  The pedicle was suture ligated using 0 Vicryl suture.  Attention was turned to the left side of the pelvis  where we suture ligated the round ligament on the left side, incised the anterior and posterior leaves of the broad ligament in standard fashion.  We decided to work on the left ovary after the hysterectomy for better visualization, so we decided to go  ahead and just clamp the triple pedicle on the left side and that pedicle was then suture ligated using 0 Vicryl suture.  We then developed the bladder flap further and then using a curved Heaney clamp placed that at the level of the internal os on the  left and the pedicle was suture ligated using 0 Vicryl suture.  Then we suture ligated after suture ligating the pedicle on the uterine artery.  At that point, we then developed the bladder flap further with sharp and blunt dissection, placing a straight  Heaney clamp across the uterosacral cardinal ligament complexes on the right and then on the left.  Each pedicle was clamped, cut, and suture ligated using 0 Vicryl suture.  We then worked our way down, staying snug beside the cervix.  Each pedicle was  clamped and cut using 0 Vicryl suture.  Once we reached the level of the external os, we placed curved Heaney clamps just beneath that on either side in standard fashion.  The specimen was removed and identified as cervix, uterus, and  right tube and  ovary.  We then suture ligated those pedicles and then closed the cuff completely in a running stitch using 2-0 Vicryl in a running lock stitch.  Irrigation was performed and hemostasis was very good.  At this point then, we decided to now focus on  removal of the left ovary and tube.  The left ovary was enlarged.  There was about a 6 to 8 cm adnexal mass consistent with an endometrioma.  We then elevated that and  then took down the filmy adhesions carefully.  We then placed curved Heaney clamp  across the remainder of pedicle staying snug beside the specimen.  The specimen was removed and sent to pathology.  The pedicle was suture ligated using 0 Vicryl suture.  At this point, we then performed irrigation again.  Hemostasis was very good.  Of  course the left side of the pelvis where this endometrioma was did look a little raw.  There was no actual active bleeder, but I did place Arista across that and across the cuff and all pedicles.  Hemostasis was very good.  All laparotomy sponges and  instruments were removed from the abdominal cavity.  The retractor was removed as well.  The peritoneum was closed using 0 Vicryl in running stitch.  The fascia was closed using 0 PDS in a running lock stitch x2 starting each corner median and midline.   Subcutaneous was closed with plain gut suture interrupted and the skin was closed with 3-0 Vicryl on a Keith needle.  Benzoin, Steri-Strips, and a honeycomb dressing were applied.  All sponge, lap, and instrument counts were correct x2.  We will remove  her Foley catheter this afternoon when she is fully awake and ambulatory.  She was extubated and went to the recovery room in stable condition.   VAI D: 10/27/2023 9:32:03 am T: 10/27/2023 9:45:00 am  JOB: [id number]/ [id number]

## 2023-10-27 NOTE — Anesthesia Procedure Notes (Signed)
Procedure Name: Intubation Date/Time: 10/27/2023 7:46 AM  Performed by: Earmon Phoenix, CRNAPre-anesthesia Checklist: Patient identified, Emergency Drugs available, Suction available, Patient being monitored and Timeout performed Patient Re-evaluated:Patient Re-evaluated prior to induction Oxygen Delivery Method: Circle system utilized Preoxygenation: Pre-oxygenation with 100% oxygen Induction Type: IV induction Ventilation: Mask ventilation without difficulty Laryngoscope Size: Mac and 3 Grade View: Grade I Tube type: Oral Tube size: 7.0 mm Airway Equipment and Method: Stylet Placement Confirmation: ETT inserted through vocal cords under direct vision, positive ETCO2 and breath sounds checked- equal and bilateral Secured at: 21 cm Tube secured with: Tape Dental Injury: Teeth and Oropharynx as per pre-operative assessment

## 2023-10-27 NOTE — Brief Op Note (Signed)
10/27/2023  9:21 AM  PATIENT:  Melanie Carey  38 y.o. female  PRE-OPERATIVE DIAGNOSIS:  ENDOMETRIOMA, LEFT OVARY  POST-OPERATIVE DIAGNOSIS:  ENDOMETRIOMA, LEFT OVARY  PROCEDURE:  Procedure(s): HYSTERECTOMY ABDOMINAL WITH SALPINGO-OOPHORECTOMY (Bilateral)  SURGEON:  Surgeons and Role:    * Marcelle Overlie, MD - Primary  PHYSICIAN ASSISTANT:   ASSISTANTS: Dr Neal Dy   ANESTHESIA:   local and general  EBL:  200 mL   BLOOD ADMINISTERED:none  DRAINS: Urinary Catheter (Foley)   LOCAL MEDICATIONS USED:  LIDOCAINE   SPECIMEN:  Source of Specimen:  cervix, uterus, tubes and ovaries   DISPOSITION OF SPECIMEN:  PATHOLOGY  COUNTS:  correct  TOURNIQUET:  * No tourniquets in log *  DICTATION: .Other Dictation: Dictation Number dictated  PLAN OF CARE: Admit for overnight observation  PATIENT DISPOSITION:  PACU - hemodynamically stable.   Delay start of Pharmacological VTE agent (>24hrs) due to surgical blood loss or risk of bleeding: not applicable

## 2023-10-27 NOTE — Transfer of Care (Signed)
Immediate Anesthesia Transfer of Care Note  Patient: Melanie Carey  Procedure(s) Performed: HYSTERECTOMY ABDOMINAL WITH SALPINGO-OOPHORECTOMY (Bilateral: Abdomen)  Patient Location: PACU  Anesthesia Type:General  Level of Consciousness: drowsy  Airway & Oxygen Therapy: Patient Spontanous Breathing and Patient connected to nasal cannula oxygen  Post-op Assessment: Report given to RN and Post -op Vital signs reviewed and stable  Post vital signs: Reviewed and stable  Last Vitals:  Vitals Value Taken Time  BP 96/65 10/27/23 0926  Temp    Pulse 62 10/27/23 0928  Resp 17 10/27/23 0928  SpO2 100 % 10/27/23 0928  Vitals shown include unfiled device data.  Last Pain:  Vitals:   10/27/23 1610  TempSrc: Oral         Complications: No notable events documented.

## 2023-10-27 NOTE — Anesthesia Preprocedure Evaluation (Signed)
Anesthesia Evaluation  Patient identified by MRN, date of birth, ID band Patient awake    Reviewed: Allergy & Precautions, NPO status , Patient's Chart, lab work & pertinent test results  History of Anesthesia Complications (+) PONV and history of anesthetic complications  Airway Mallampati: II  TM Distance: >3 FB Neck ROM: Full    Dental no notable dental hx.    Pulmonary neg pulmonary ROS   Pulmonary exam normal        Cardiovascular hypertension, Pt. on medications and Pt. on home beta blockers  Rhythm:Regular Rate:Normal     Neuro/Psych   Anxiety Depression    negative neurological ROS     GI/Hepatic Neg liver ROS,GERD  ,,  Endo/Other  negative endocrine ROS    Renal/GU negative Renal ROS  Female GU complaint     Musculoskeletal negative musculoskeletal ROS (+)    Abdominal Normal abdominal exam  (+)   Peds  Hematology  (+) Blood dyscrasia, anemia Lab Results      Component                Value               Date                      WBC                      7.2                 10/24/2023                HGB                      13.2                10/24/2023                HCT                      38.7                10/24/2023                MCV                      76.9 (L)            10/24/2023                PLT                      350                 10/24/2023              Anesthesia Other Findings   Reproductive/Obstetrics                             Anesthesia Physical Anesthesia Plan  ASA: 2  Anesthesia Plan: General   Post-op Pain Management: Tylenol PO (pre-op)* and Celebrex PO (pre-op)*   Induction: Intravenous  PONV Risk Score and Plan: 4 or greater and Ondansetron, Dexamethasone, Midazolam, Scopolamine patch - Pre-op, Amisulpride and Treatment may vary due to age or medical condition  Airway Management Planned: Mask and Oral ETT  Additional Equipment:  None  Intra-op Plan:  Post-operative Plan: Extubation in OR  Informed Consent: I have reviewed the patients History and Physical, chart, labs and discussed the procedure including the risks, benefits and alternatives for the proposed anesthesia with the patient or authorized representative who has indicated his/her understanding and acceptance.     Dental advisory given  Plan Discussed with: CRNA  Anesthesia Plan Comments:        Anesthesia Quick Evaluation

## 2023-10-28 ENCOUNTER — Encounter (HOSPITAL_BASED_OUTPATIENT_CLINIC_OR_DEPARTMENT_OTHER): Payer: Self-pay | Admitting: Obstetrics and Gynecology

## 2023-10-28 DIAGNOSIS — N80122 Deep endometriosis of left ovary: Secondary | ICD-10-CM | POA: Diagnosis not present

## 2023-10-28 LAB — CBC
HCT: 32.1 % — ABNORMAL LOW (ref 36.0–46.0)
Hemoglobin: 11.5 g/dL — ABNORMAL LOW (ref 12.0–15.0)
MCH: 26.6 pg (ref 26.0–34.0)
MCHC: 35.8 g/dL (ref 30.0–36.0)
MCV: 74.1 fL — ABNORMAL LOW (ref 80.0–100.0)
Platelets: 341 10*3/uL (ref 150–400)
RBC: 4.33 MIL/uL (ref 3.87–5.11)
RDW: 14.4 % (ref 11.5–15.5)
WBC: 17.3 10*3/uL — ABNORMAL HIGH (ref 4.0–10.5)
nRBC: 0 % (ref 0.0–0.2)

## 2023-10-28 LAB — GLUCOSE, CAPILLARY: Glucose-Capillary: 126 mg/dL — ABNORMAL HIGH (ref 70–99)

## 2023-10-28 LAB — SURGICAL PATHOLOGY

## 2023-10-28 MED ORDER — KETOROLAC TROMETHAMINE 30 MG/ML IJ SOLN
INTRAMUSCULAR | Status: AC
Start: 2023-10-28 — End: ?
  Filled 2023-10-28: qty 1

## 2023-10-28 MED ORDER — ACETAMINOPHEN 325 MG PO TABS
ORAL_TABLET | ORAL | Status: AC
  Filled 2023-10-28: qty 2

## 2023-10-28 MED ORDER — ACETAMINOPHEN 325 MG PO TABS
ORAL_TABLET | ORAL | Status: AC
Start: 2023-10-28 — End: ?
  Filled 2023-10-28: qty 2

## 2023-10-28 MED ORDER — IBUPROFEN 600 MG PO TABS: 600.0000 mg | ORAL_TABLET | Freq: Four times a day (QID) | ORAL | 0 refills | Status: AC

## 2023-10-28 MED ORDER — HYDROMORPHONE HCL 2 MG PO TABS: 2.0000 mg | ORAL_TABLET | ORAL | 0 refills | Status: AC | PRN

## 2023-10-28 NOTE — Discharge Instructions (Signed)
  Post Anesthesia Home Care Instructions  Activity: Get plenty of rest for the remainder of the day. A responsible individual must stay with you for 24 hours following the procedure.  For the next 24 hours, DO NOT: -Drive a car -Advertising copywriter -Drink alcoholic beverages -Take any medication unless instructed by your physician -Make any legal decisions or sign important papers.  Meals: Start with liquid foods such as gelatin or soup. Progress to regular foods as tolerated. Avoid greasy, spicy, heavy foods. If nausea and/or vomiting occur, drink only clear liquids until the nausea and/or vomiting subsides. Call your physician if vomiting continues.  Special Instructions/Symptoms: Your throat may feel dry or sore from the anesthesia or the breathing tube placed in your throat during surgery. If this causes discomfort, gargle with warm salt water. The discomfort should disappear within 24 hours.   Please call Dr Lynnell Dike office to make a follow up surgical appointment to see her in the office in one (1) week

## 2023-10-28 NOTE — Progress Notes (Signed)
 Attending MD at bedside to discuss DC plan. Updated MD on current status. Plan is to have client stay till noon today. At 1100hrs, will reassess Orthostatic VS, assess ambulation/ gait status, pain status and PO intake. Will provide information to attending MD. Currently resting quietly, has expressed interest and is motivated to be DC to home. Will continue to monitor and observe client until DC time.

## 2023-10-28 NOTE — Progress Notes (Signed)
 Patient up to [facility] with the help of 3 staff members for safety. Patient able to stand and pivot without issue. After voiding patient stayed seated for about 2-3 minutes. She complained of feeling hot and ringing in her ears but was not dizzy. Staff used fan and cold towel to cool patient down. Patient was able to pivot back to bed safely with assist.

## 2023-10-28 NOTE — Progress Notes (Signed)
 Dr Mat MD phoned unit. Update of pt status provided. Agrees that client meets DC criteria to home with husband, with primary instructions for client to follow up with MD in her office in one week. Client did ambulate in hall approx 10-12 feet and felt tired did provide chair to sit down, after a very short rest, client was able to stand independently, voiced no complaints and was able to ambulate to the bed with very minimal assistance. DC instructions were reviewed with patient and husband on PO fluid intake, pain control, ambulating at home, monitoring for s/s of infection and reinforced making MD appt in one week. Also discussed with pt and husband of times to notify the MD for issues. Opportunity for questions provided during DC teaching.

## 2023-10-28 NOTE — Progress Notes (Signed)
Pt resting in bed w/o complaints.  Dr. Vincente Poli in to see pt.  CBG 126.  Honeycomb dressing replaced.  Pt in no apparent distress.

## 2023-10-28 NOTE — Discharge Summary (Signed)
 Admission Diagnosis: Left ovarian endometrioma  Discharge Diagnosis: Same  Hospital Course: 38 year old female admitted for TAH/BSO and removal of left ovarian endometrioma.  On POD # 0 she had some postoperative nausea that was treated with Zofran .  She did experience two syncopal episodes  When she passed out one time, she did bump her head on the bathroom wall.  The nurse called me and we did a CT scan of her head which was negative.  During these episodes her vital signs were stable and urine output wnl and clear.  On the morning of POD # 1, the nausea was resolved and hemoglobiin was very good. She tolerated a regular diet and was voiding and passing flatus.  Incision was dry and Abdomen non distended.  On the morning of POD # 1, she ambulated with assistance and no further syncope occurred. She was discharged home around lunchtime on POD # 1. Orthostatics on POD # 1 were WNL BP 101/62 (BP Location: Left Arm)   Pulse 100   Temp (!) 100.4 F (38 C)   Resp 18   Ht 5' 8 (1.727 m)   Wt 87.5 kg   LMP 09/26/2023 (Exact Date)   SpO2 98%   BMI 29.35 kg/m  Results for orders placed or performed during the hospital encounter of 10/27/23 (from the past 24 hours)  CBC     Status: Abnormal   Collection Time: 10/28/23  4:31 AM  Result Value Ref Range   WBC 17.3 (H) 4.0 - 10.5 K/uL   RBC 4.33 3.87 - 5.11 MIL/uL   Hemoglobin 11.5 (L) 12.0 - 15.0 g/dL   HCT 67.8 (L) 63.9 - 53.9 %   MCV 74.1 (L) 80.0 - 100.0 fL   MCH 26.6 26.0 - 34.0 pg   MCHC 35.8 30.0 - 36.0 g/dL   RDW 85.5 88.4 - 84.4 %   Platelets 341 150 - 400 K/uL   nRBC 0.0 0.0 - 0.2 %  Glucose, capillary     Status: Abnormal   Collection Time: 10/28/23  8:56 AM  Result Value Ref Range   Glucose-Capillary 126 (H) 70 - 99 mg/dL  Abdomen is soft and non tender  Incision clean and dry   Patient discharged home in good condition. Advised to follow up with Dr Mat in one week Given strict post op precautions

## 2023-10-28 NOTE — Progress Notes (Signed)
 POD # 1  Events overnight - patient had two syncopal episodes and during one episode hit head on bathroom wall  The nurse contacted me and I ordered CT scan of head which was Normal. Patients vital signs and urine output have remained good. This morning, the patient has had a regular breakfast - her nausea is gone. Pain is minimal and appropriate Mentation normal. She required assistance to the bathroom but did not pass out  BP 129/80 (BP Location: Left Arm)   Pulse 71   Temp (!) 100.4 F (38 C)   Resp 20   Ht 5' 8 (1.727 m)   Wt 87.5 kg   LMP 09/26/2023 (Exact Date)   SpO2 95%   BMI 29.35 kg/m  Results for orders placed or performed during the hospital encounter of 10/27/23 (from the past 24 hours)  CBC     Status: Abnormal   Collection Time: 10/28/23  4:31 AM  Result Value Ref Range   WBC 17.3 (H) 4.0 - 10.5 K/uL   RBC 4.33 3.87 - 5.11 MIL/uL   Hemoglobin 11.5 (L) 12.0 - 15.0 g/dL   HCT 67.8 (L) 63.9 - 53.9 %   MCV 74.1 (L) 80.0 - 100.0 fL   MCH 26.6 26.0 - 34.0 pg   MCHC 35.8 30.0 - 36.0 g/dL   RDW 85.5 88.4 - 84.4 %   Platelets 341 150 - 400 K/uL   nRBC 0.0 0.0 - 0.2 %   Abdomen is soft and flat  Incision is clean and dry   IMPRESSION: POD # 1  Post op syncope PLAN: Discussion with nursing team  Will delay discharge  Ambulate slowly  Check CBG Repeat orthostatics  Reevaluate later this morning for discharge

## 2023-10-29 ENCOUNTER — Emergency Department (HOSPITAL_COMMUNITY): Payer: Medicaid Other

## 2023-10-29 ENCOUNTER — Encounter (HOSPITAL_COMMUNITY): Payer: Self-pay

## 2023-10-29 ENCOUNTER — Other Ambulatory Visit: Payer: Self-pay

## 2023-10-29 ENCOUNTER — Emergency Department (HOSPITAL_COMMUNITY)
Admission: EM | Admit: 2023-10-29 | Discharge: 2023-10-30 | Disposition: A | Discharge status: Home / Self Care | Payer: Medicaid Other | Attending: Emergency Medicine | Admitting: Emergency Medicine

## 2023-10-29 DIAGNOSIS — S32592A Other specified fracture of left pubis, initial encounter for closed fracture: Secondary | ICD-10-CM | POA: Insufficient documentation

## 2023-10-29 DIAGNOSIS — S3210XA Unspecified fracture of sacrum, initial encounter for closed fracture: Secondary | ICD-10-CM | POA: Insufficient documentation

## 2023-10-29 DIAGNOSIS — S3282XA Multiple fractures of pelvis without disruption of pelvic ring, initial encounter for closed fracture: Secondary | ICD-10-CM

## 2023-10-29 DIAGNOSIS — W19XXXA Unspecified fall, initial encounter: Secondary | ICD-10-CM | POA: Diagnosis not present

## 2023-10-29 DIAGNOSIS — S79912A Unspecified injury of left hip, initial encounter: Secondary | ICD-10-CM | POA: Diagnosis present

## 2023-10-29 DIAGNOSIS — Z79899 Other long term (current) drug therapy: Secondary | ICD-10-CM | POA: Diagnosis not present

## 2023-10-29 NOTE — ED Provider Triage Note (Signed)
 Emergency Medicine Provider Triage Evaluation Note  Melanie Carey , a 38 y.o. female who is status post total hysterectomy on 2/3 was evaluated in triage.  Pt complains of left sided lumbar pain and groin pain following mechanical fall while at the hospital on Monday.  Pain is primarily only with weightbearing. Review of Systems  Positive:  Negative:   Physical Exam  BP 126/80 (BP Location: Left Arm)   Pulse 78   Temp 98.8 F (37.1 C) (Oral)   Resp 16   Ht 5' 8 (1.727 m)   Wt 87.5 kg   LMP 09/26/2023 (Exact Date)   SpO2 100%   BMI 29.33 kg/m  Gen:   Awake, no distress   Resp:  Normal effort  MSK:   Moves extremities without difficulty, 5 out of 5 lower extremity strength, sensation intact, no midline spinal tenderness or gross deformity, left-sided lumbar paraspinal tenderness appreciated.  Hysterectomy incision and sterile dressing without any surrounding drainage, erythema   Medical Decision Making  Medically screening exam initiated at 2:31 PM.  Appropriate orders placed.  Melanie Carey was informed that the remainder of the evaluation will be completed by another provider, this initial triage assessment does not replace that evaluation, and the importance of remaining in the ED until their evaluation is complete.     Donnajean Lynwood DEL, PA-C 10/29/23 1433

## 2023-10-29 NOTE — ED Triage Notes (Signed)
 Patient is here for evaluation of lower back pain and pain in the left groin. Pt reports recently having a hysterectomy and yesterday while in the hospital had a syncopal episode. Pt reports they did a CT scan on her head, but now she is having back pain and pain in the left sided groin. Pt reports it hurts bear weight on the left leg. Unsure what she landed on or hit when falling.

## 2023-10-29 NOTE — ED Provider Notes (Signed)
  EMERGENCY DEPARTMENT AT [facility] Provider Note   CSN: [id number] Arrival date & time: 10/29/23  1345     History  Chief Complaint  Patient presents with   Back Pain    ASALEE BARRETTE is a 38 y.o. female.  HPI    38 year old female comes in with chief complaint of back pain.  Patient is 2 days postop from hysterectomy.  She had a mechanical fall the night after her hysterectomy.  She had a CT scan of her brain, which was reassuring.  She had complained of back pain, but the staff thought that most likely she had a muscle strain.  After the discharge, she continues to have difficulty with bearing weight on the left side, and decided to come to the ER.  Home Medications Prior to Admission medications   Medication Sig Start Date End Date Taking? Authorizing Provider  escitalopram  (LEXAPRO ) 20 MG tablet Take 20 mg by mouth daily.    [provider]  HYDROmorphone  (DILAUDID ) 2 MG tablet Take 1 tablet (2 mg total) by mouth every 3 (three) hours as needed for severe pain (pain score 7-10) ((when tolerating fluids)). 10/28/23   Mat Browning, MD  ibuprofen  (ADVIL ) 600 MG tablet Take 1 tablet (600 mg total) by mouth every 6 (six) hours. 10/28/23   Mat Browning, MD  metoprolol  succinate (TOPROL -XL) 50 MG 24 hr tablet Take 50 mg by mouth daily. Take with or immediately following a meal.    [provider]      Allergies    Hydrocodone    Review of Systems   Review of Systems  All other systems reviewed and are negative.   Physical Exam Updated Vital Signs BP 132/80   Pulse 67   Temp 98.4 F (36.9 C)   Resp 18   Ht 5' 8 (1.727 m)   Wt 87.5 kg   LMP 09/26/2023 (Exact Date)   SpO2 99%   BMI 29.33 kg/m  Physical Exam Vitals and nursing note reviewed.  Constitutional:      Appearance: She is well-developed.  HENT:     Head: Normocephalic and atraumatic.  Eyes:     Extraocular Movements: Extraocular movements intact.      Pupils: Pupils are equal, round, and reactive to light.  Neck:     Comments: No midline c-spine tenderness Cardiovascular:     Rate and Rhythm: Normal rate and regular rhythm.  Pulmonary:     Effort: Pulmonary effort is normal.     Breath sounds: Normal breath sounds.  Abdominal:     General: Bowel sounds are normal.     Palpations: Abdomen is soft.     Tenderness: There is no abdominal tenderness.  Musculoskeletal:        General: No deformity.     Cervical back: Neck supple.     Comments: Able to tolerate passive leg raise both sides  Skin:    General: Skin is warm and dry.  Neurological:     Mental Status: She is alert and oriented to person, place, and time.     ED Results / Procedures / Treatments   Labs (all labs ordered are listed, but only abnormal results are displayed) Labs Reviewed - No data to display  EKG None  Radiology CT PELVIS WO CONTRAST Result Date: 10/29/2023 CLINICAL DATA:  Hip trauma EXAM: CT PELVIS WITHOUT CONTRAST TECHNIQUE: Multidetector CT imaging of the pelvis was performed following the standard protocol without intravenous contrast. RADIATION DOSE REDUCTION:  This exam was performed according to the departmental dose-optimization program which includes automated exposure control, adjustment of the mA and/or kV according to patient size and/or use of iterative reconstruction technique. COMPARISON:  None Available. FINDINGS: Urinary Tract:  No abnormality visualized. Bowel: There is a small amount of free intraperitoneal air within the left lower quadrant and prevesical space without clear origin. Vascular/Lymphatic: No pathologically enlarged lymph nodes. No significant vascular abnormality seen. Reproductive:  No mass or other significant abnormality Other:  None Musculoskeletal: Minimally displaced fracture of the left sacral wing and left inferior pubic ramus. IMPRESSION: 1. Minimally displaced fracture of the left sacral wing and left inferior pubic  ramus. 2. Small amount of free intraperitoneal air within the left lower quadrant and prevesical space without clear origin. The sigmoid colon may be the most likely source given its relative proximity to the sacrum. Critical Value/emergent results were called by telephone at the time of interpretation on 10/29/2023 at 11:23 pm to Dr. Emil, who verbally acknowledged these results. Electronically Signed   By: Franky Stanford M.D.   On: 10/29/2023 23:24   DG Pelvis 1-2 Views Result Date: 10/29/2023 CLINICAL DATA:  Fall and back pain. EXAM: PELVIS - 1-2 VIEW COMPARISON:  None Available. FINDINGS: There is fracture of the left sacral ala. There is slight step-off of the symphysis pubis. No dislocation. No significant arthritic changes. The soft tissues are unremarkable. IMPRESSION: 1. Fracture of the left sacral ala. Further evaluation with pelvic CT is recommended. 2. Slight step-off of the symphysis pubis. Electronically Signed   By: Vanetta Chou M.D.   On: 10/29/2023 16:17   CT HEAD WO CONTRAST ( ) Result Date: 10/28/2023 CLINICAL DATA:  Minor head trauma. Normal mental status. Patient is postoperative. Passed out and fell in the bathroom, striking forehead on the wall. EXAM: CT HEAD WITHOUT CONTRAST TECHNIQUE: Contiguous axial images were obtained from the base of the skull through the vertex without intravenous contrast. RADIATION DOSE REDUCTION: This exam was performed according to the departmental dose-optimization program which includes automated exposure control, adjustment of the mA and/or kV according to patient size and/or use of iterative reconstruction technique. COMPARISON:  None Available. FINDINGS: Brain: No evidence of acute infarction, hemorrhage, hydrocephalus, extra-axial collection or mass lesion/mass effect. Vascular: No hyperdense vessel or unexpected calcification. Skull: Normal. Negative for fracture or focal lesion. Sinuses/Orbits: No acute finding. Other: None. IMPRESSION: No acute  intracranial abnormalities. Electronically Signed   By: Elsie Gravely M.D.   On: 10/28/2023 00:22    Procedures Procedures    Medications Ordered in ED Medications - No data to display  ED Course/ Medical Decision Making/ A&P                                 Medical Decision Making Amount and/or Complexity of Data Reviewed Radiology: ordered.   38 year old female comes in with chief complaint of fall 2 days ago.  She has been having pain in her back ever since then.  Patient is also having pain in the pelvic region/pubic symphysis region.  Differential diagnosis for her includes pelvic fracture, hip dislocation, hip contusion, hematoma.  X-ray of the pelvis ordered.  I have independently interpreted the x-ray, no evidence of fracture or dislocation.  CT pelvis ordered, it reveals stable pelvic fractures.  Case discussed with Dr. Josefina, orthopedic surgery.  He is comfortable with patient being weightbearing as tolerated, and close follow-up.  Patient was concerned  about tension to her wound site and this concern was discussed with Dr. Josefina as well.  He will try to get patient in as soon as possible.  Final Clinical Impression(s) / ED Diagnoses Final diagnoses:  Multiple closed fractures of pelvis without disruption of pelvic ring, initial encounter [facility])    Rx / DC Orders ED Discharge Orders     None         Charlyn Sora, MD 10/29/23 2354

## 2023-10-29 NOTE — Discharge Instructions (Signed)
 Please call Dr. Agatha Horsfall for a close follow up for the stable pelvic fracture.

## 2024-07-22 ENCOUNTER — Other Ambulatory Visit: Payer: Self-pay | Admitting: Obstetrics and Gynecology

## 2024-07-22 DIAGNOSIS — Z1231 Encounter for screening mammogram for malignant neoplasm of breast: Secondary | ICD-10-CM

## 2024-08-17 ENCOUNTER — Ambulatory Visit
Admission: RE | Admit: 2024-08-17 | Discharge: 2024-08-17 | Disposition: A | Discharge status: Home / Self Care | Source: Ambulatory Visit | Attending: Obstetrics and Gynecology | Admitting: Obstetrics and Gynecology

## 2024-08-17 DIAGNOSIS — Z1231 Encounter for screening mammogram for malignant neoplasm of breast: Secondary | ICD-10-CM
